# Patient Record
Sex: Female | Born: 1980 | Race: White | Hispanic: No | Marital: Single | Smoking: Never smoker
Health system: Southern US, Community
[De-identification: ages and names within clinical notes are randomized; demographics above are authoritative.]

## PROBLEM LIST (undated history)

## (undated) DIAGNOSIS — Z8041 Family history of malignant neoplasm of ovary: Secondary | ICD-10-CM

## (undated) DIAGNOSIS — D249 Benign neoplasm of unspecified breast: Secondary | ICD-10-CM

## (undated) DIAGNOSIS — Z973 Presence of spectacles and contact lenses: Secondary | ICD-10-CM

## (undated) DIAGNOSIS — N921 Excessive and frequent menstruation with irregular cycle: Secondary | ICD-10-CM

## (undated) DIAGNOSIS — N920 Excessive and frequent menstruation with regular cycle: Secondary | ICD-10-CM

## (undated) DIAGNOSIS — N946 Dysmenorrhea, unspecified: Secondary | ICD-10-CM

## (undated) DIAGNOSIS — D219 Benign neoplasm of connective and other soft tissue, unspecified: Secondary | ICD-10-CM

## (undated) DIAGNOSIS — N809 Endometriosis, unspecified: Secondary | ICD-10-CM

## (undated) DIAGNOSIS — Z5189 Encounter for other specified aftercare: Secondary | ICD-10-CM

## (undated) DIAGNOSIS — D259 Leiomyoma of uterus, unspecified: Secondary | ICD-10-CM

## (undated) DIAGNOSIS — D649 Anemia, unspecified: Secondary | ICD-10-CM

## (undated) DIAGNOSIS — E269 Hyperaldosteronism, unspecified: Secondary | ICD-10-CM

## (undated) HISTORY — DX: Excessive and frequent menstruation with regular cycle: N92.0

## (undated) HISTORY — PX: OVARIAN CYST REMOVAL: SHX89

## (undated) HISTORY — DX: Encounter for other specified aftercare: Z51.89

## (undated) HISTORY — DX: Benign neoplasm of unspecified breast: D24.9

## (undated) HISTORY — DX: Family history of malignant neoplasm of ovary: Z80.41

## (undated) HISTORY — DX: Benign neoplasm of connective and other soft tissue, unspecified: D21.9

## (undated) HISTORY — PX: LAPAROSCOPIC OVARIAN CYSTECTOMY: SHX6248

---
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
## 2011-02-20 HISTORY — PX: LAPAROSCOPIC OVARIAN CYSTECTOMY: SHX6248

## 2019-08-21 LAB — HM PAP SMEAR

## 2019-10-25 ENCOUNTER — Other Ambulatory Visit: Payer: Self-pay

## 2019-10-25 ENCOUNTER — Encounter (HOSPITAL_COMMUNITY): Payer: Self-pay | Admitting: Emergency Medicine

## 2019-10-25 ENCOUNTER — Emergency Department (HOSPITAL_COMMUNITY)
Admission: EM | Admit: 2019-10-25 | Discharge: 2019-10-26 | Disposition: A | Discharge status: Home / Self Care | Payer: BC Managed Care – PPO | Attending: Emergency Medicine | Admitting: Emergency Medicine

## 2019-10-25 ENCOUNTER — Emergency Department (HOSPITAL_COMMUNITY): Payer: BC Managed Care – PPO

## 2019-10-25 DIAGNOSIS — Y939 Activity, unspecified: Secondary | ICD-10-CM | POA: Diagnosis not present

## 2019-10-25 DIAGNOSIS — S59902A Unspecified injury of left elbow, initial encounter: Secondary | ICD-10-CM | POA: Diagnosis present

## 2019-10-25 DIAGNOSIS — S52125A Nondisplaced fracture of head of left radius, initial encounter for closed fracture: Secondary | ICD-10-CM | POA: Diagnosis not present

## 2019-10-25 DIAGNOSIS — W1839XA Other fall on same level, initial encounter: Secondary | ICD-10-CM | POA: Insufficient documentation

## 2019-10-25 DIAGNOSIS — S80812A Abrasion, left lower leg, initial encounter: Secondary | ICD-10-CM

## 2019-10-25 DIAGNOSIS — Y929 Unspecified place or not applicable: Secondary | ICD-10-CM | POA: Insufficient documentation

## 2019-10-25 DIAGNOSIS — Y999 Unspecified external cause status: Secondary | ICD-10-CM | POA: Diagnosis not present

## 2019-10-25 DIAGNOSIS — W19XXXA Unspecified fall, initial encounter: Secondary | ICD-10-CM

## 2019-10-25 MED ORDER — BACITRACIN ZINC 500 UNIT/GM EX OINT
1.0000 "application " | TOPICAL_OINTMENT | Freq: Two times a day (BID) | CUTANEOUS | Status: DC
  Administered 2019-10-26: 1 via TOPICAL
  Filled 2019-10-25: qty 0.9

## 2019-10-25 NOTE — ED Triage Notes (Signed)
Pt c/o left wrist and elbow pain after a fall today. Denies hitting her head, no LOC.

## 2019-10-25 NOTE — ED Triage Notes (Signed)
Emergency Medicine Provider Triage Evaluation Note  Margaret Farrell , a 39 y.o. female  was evaluated in triage.  Pt complains of an injury to the left upper extremity when she fell off a porch that is less than 1 foot off the ground.  She fell onto her left side, she is right-hand dominant.  She caught her self with her left hand and outstretched position feeling a crunch elbow and her wrist  Review of Systems  Positive: Pain swelling extremity Negative: Numbness, head injury, neck pain  Physical Exam  BP 140/82 (BP Location: Right Arm)   Pulse 99   Temp 98.6 F (37 C) (Oral)   Resp 18   SpO2 98%  Gen:   Awake, no distress   HEENT:  Atraumatic  Resp:  Normal effort  Cardiac:  Normal rate  MSK:   Moves extremities without difficulty except for the left upper extremity where she is unable to fully flex or extend at the elbow, she can pronate and 70 with minimal pain, her left wrist has a very supple appearance with no restricted range of motion, she can grip without any difficulty Neuro:  Speech clear, movements are coordinated, strength is normal, she has amblyopia  Medical Decision Making  Medically screening exam initiated at 8:20 PM.  Appropriate orders placed.  Margaret Farrell was informed that the remainder of the evaluation will be completed by another provider, this initial triage assessment does not replace that evaluation, and the importance of remaining in the ED until their evaluation is complete.  Clinical Impression  Concern for possible fracture of the elbow or wrist or forearm, otherwise no significant injuries other than the abrasion to the left lower extremity which was treated with local care.  Patient agreeable x-rays x-rays   Margaret Chapel, MD 10/25/19 2021

## 2019-10-26 MED ORDER — IBUPROFEN 800 MG PO TABS
800.0000 mg | ORAL_TABLET | Freq: Once | ORAL | Status: AC
  Administered 2019-10-26: 800 mg via ORAL
  Filled 2019-10-26: qty 1

## 2019-10-26 NOTE — ED Notes (Signed)
Ortho tech at bedside 

## 2019-10-26 NOTE — Discharge Instructions (Addendum)
Take ibuprofen 3 times a day with meals.  Do not take other anti-inflammatories at the same time (Advil, Motrin, naproxen, Aleve). You may supplement with Tylenol if you need further pain control. Use ice packs  for pain and swelling.  Try to keep your elbow elevated to decrease pain and swelling. Use the sling as needed for pain. Start gentle movements when you are able to based on pain.  Call the orthopedic doctor listed below to set up a follow up appointment. Return to the ER if your hand changes color, goes numb, or with any new, worsening, or concerning symptoms.

## 2019-10-26 NOTE — ED Notes (Signed)
Wound care done to L[facility]

## 2019-10-26 NOTE — ED Notes (Signed)
Pt d/c home per MD order. Discharge summary reviewed with pt, pt verbalizes understanding. Ambulatory off unit with visitor. No s/s of acute distress noted.

## 2019-10-26 NOTE — ED Notes (Signed)
Pt called multiple times for room, no answer.

## 2019-10-26 NOTE — ED Provider Notes (Signed)
[facility] EMERGENCY DEPARTMENT Provider Note   CSN: [id number] Arrival date & time: 10/25/19  1959     History Chief Complaint  Patient presents with   Margaret Farrell is a 39 y.o. female presenting for evaluation of left elbow pain after fall.  Patient states last night she was going up a door when she missed a step, fell onto her left side.  She reached out with her left arm.  She not hit her head or lose consciousness.  She was able to stand up and walk after the fall.  She reports a scrape of her left leg, and some mild low back achiness, but no pain elsewhere.  She is not on blood thinners.  She took ibuprofen for symptoms, has not taken anything else.  She denies chest pain, shortness breath, abdominal pain, loss of bowel bladder control, numbness, or tingling.  She does not have an orthopedic doctor.  She denies previous injury to this elbow.  She recently moved to the area, has not established PCP.   HPI     History reviewed. No pertinent past medical history.  There are no problems to display for this patient.   History reviewed. No pertinent surgical history.   OB History   No obstetric history on file.     No family history on file.  Social History   Tobacco Use   Smoking status: Not on file  Substance Use Topics   Alcohol use: Not on file   Drug use: Not on file    Home Medications Prior to Admission medications   Not on File    Allergies    Sulfa antibiotics  Review of Systems   Review of Systems  Musculoskeletal: Positive for arthralgias and joint swelling.  Neurological: Negative for numbness.  Hematological: Does not bruise/bleed easily.    Physical Exam Updated Vital Signs BP (!) 132/92    Pulse 92    Temp 98.7 F (37.1 C) (Oral)    Resp 18    SpO2 98%   Physical Exam Vitals and nursing note reviewed.  Constitutional:      General: She is not in acute distress.    Appearance: She is well-developed.      Comments: Sitting in the bed in no acute distress  HENT:     Head: Normocephalic and atraumatic.  Cardiovascular:     Rate and Rhythm: Normal rate and regular rhythm.     Pulses: Normal pulses.  Pulmonary:     Effort: Pulmonary effort is normal.     Breath sounds: Normal breath sounds.  Abdominal:     General: There is no distension.     Palpations: There is no mass.     Tenderness: There is no abdominal tenderness. There is no guarding or rebound.  Musculoskeletal:        General: Swelling and tenderness present.     Cervical back: Normal range of motion.  Skin:    General: Skin is warm.     Capillary Refill: Capillary refill takes less than 2 seconds.     Findings: No rash.     Comments: Superficial abrasion of the left lateral lower leg.  No tenderness palpation over the tibia or fibula. No tenderness palpation of back midline spine.  No step-offs or deformities.  Mild soreness with palpation of the low back without focal pain. Mild swelling of the left elbow.  Full active range of motion with pain.  Radial pulses  2+ bilaterally.  Grip strength equal bilaterally.  No tenderness palpation of the wrist or hand.  No tenderness to palpation of the shoulder.  Ambulatory with no difficulty.  Neurological:     Mental Status: She is alert and oriented to person, place, and time.     ED Results / Procedures / Treatments   Labs (all labs ordered are listed, but only abnormal results are displayed) Labs Reviewed - No data to display  EKG None  Radiology DG ELBOW COMPLETE LEFT (3+VIEW)  Result Date: 10/25/2019 CLINICAL DATA:  Fall, arm pain EXAM: LEFT ELBOW - COMPLETE 3+ VIEW COMPARISON:  Left forearm series today FINDINGS: There is a fracture through the left radial head. Associated joint effusion. No additional acute bony abnormality. IMPRESSION: Left radial head fracture. Electronically Signed   By: Rolm Baptise M.D.   On: 10/25/2019 20:57   DG Forearm Left  Result Date:  10/25/2019 CLINICAL DATA:  Fall, arm pain EXAM: LEFT FOREARM - 2 VIEW COMPARISON:  Elbow series and forearm series today FINDINGS: There is a left elbow joint effusion. Fracture noted through the left radial head. No additional forearm fracture. IMPRESSION: Left radial head fracture. Electronically Signed   By: Rolm Baptise M.D.   On: 10/25/2019 20:51   DG Wrist Complete Left  Result Date: 10/25/2019 CLINICAL DATA:  Fall, left arm pain EXAM: LEFT WRIST - COMPLETE 3+ VIEW COMPARISON:  None. FINDINGS: There is no evidence of fracture or dislocation. There is no evidence of arthropathy or other focal bone abnormality. Soft tissues are unremarkable. IMPRESSION: Negative. Electronically Signed   By: Rolm Baptise M.D.   On: 10/25/2019 20:51    Procedures Procedures (including critical care time)  Medications Ordered in ED Medications  bacitracin ointment 1 application (1 application Topical Given 10/26/19 0842)  ibuprofen (ADVIL) tablet 800 mg (800 mg Oral Given 10/26/19 0843)    ED Course  I have reviewed the triage vital signs and the nursing notes.  Pertinent labs & imaging results that were available during my care of the patient were reviewed by me and considered in my medical decision making (see chart for details).  MDM Rules/Calculators/A&P                          Patient presenting for evaluation of left elbow pain and swelling.  On exam, patient appears nontoxic.  She is neurovascular intact.  She has mild swelling and tenderness of the left elbow.  She is an abrasion of her left leg.  X-rays obtained from triage read interpreted by me, shows a radial head fracture which is nondisplaced.  No fracture noted in the wrist, and clinically patient without signs of fracture.  She not hit her head or lose consciousness, I do not believe she needs a head CT.  Discussed findings with patient.  Discussed sling as needed for pain control.  Discussed importance of early mobilization of the elbow.   Encourage patient to follow-up with orthopedics for further evaluation.  Offered pain medication, patient declined, requesting only ibuprofen.  Discussed importance of rest, ice, elevation.  At this time, patient appears safe for discharge.  Return precautions given.  Patient states she understands and agrees to plan.   Final Clinical Impression(s) / ED Diagnoses Final diagnoses:  Closed nondisplaced fracture of head of left radius, initial encounter  Abrasion, left lower leg, initial encounter  Fall, initial encounter    Rx / DC Orders ED Discharge Orders  None       Franchot Heidelberg, PA-C 10/26/19 [id number]    Blanchie Dessert, MD 10/26/19 1555

## 2019-10-26 NOTE — Progress Notes (Signed)
Orthopedic Tech Progress Note Patient Details:  Margaret Farrell 04-21-1980 [id number]  Ortho Devices Type of Ortho Device: Shoulder immobilizer Ortho Device/Splint Location: LUE Ortho Device/Splint Interventions: Ordered, Application   Post Interventions Patient Tolerated: Well Instructions Provided: Care of [place] 10/26/2019, 8:58 AM

## 2021-09-26 IMAGING — DX DG WRIST COMPLETE 3+V*L*
4 series · 4 of 4 positions shown · non-contrast
Comparison: None.

CLINICAL DATA: Fall, left arm pain

EXAM:
LEFT WRIST - COMPLETE 3+ VIEW

[wrist pa]
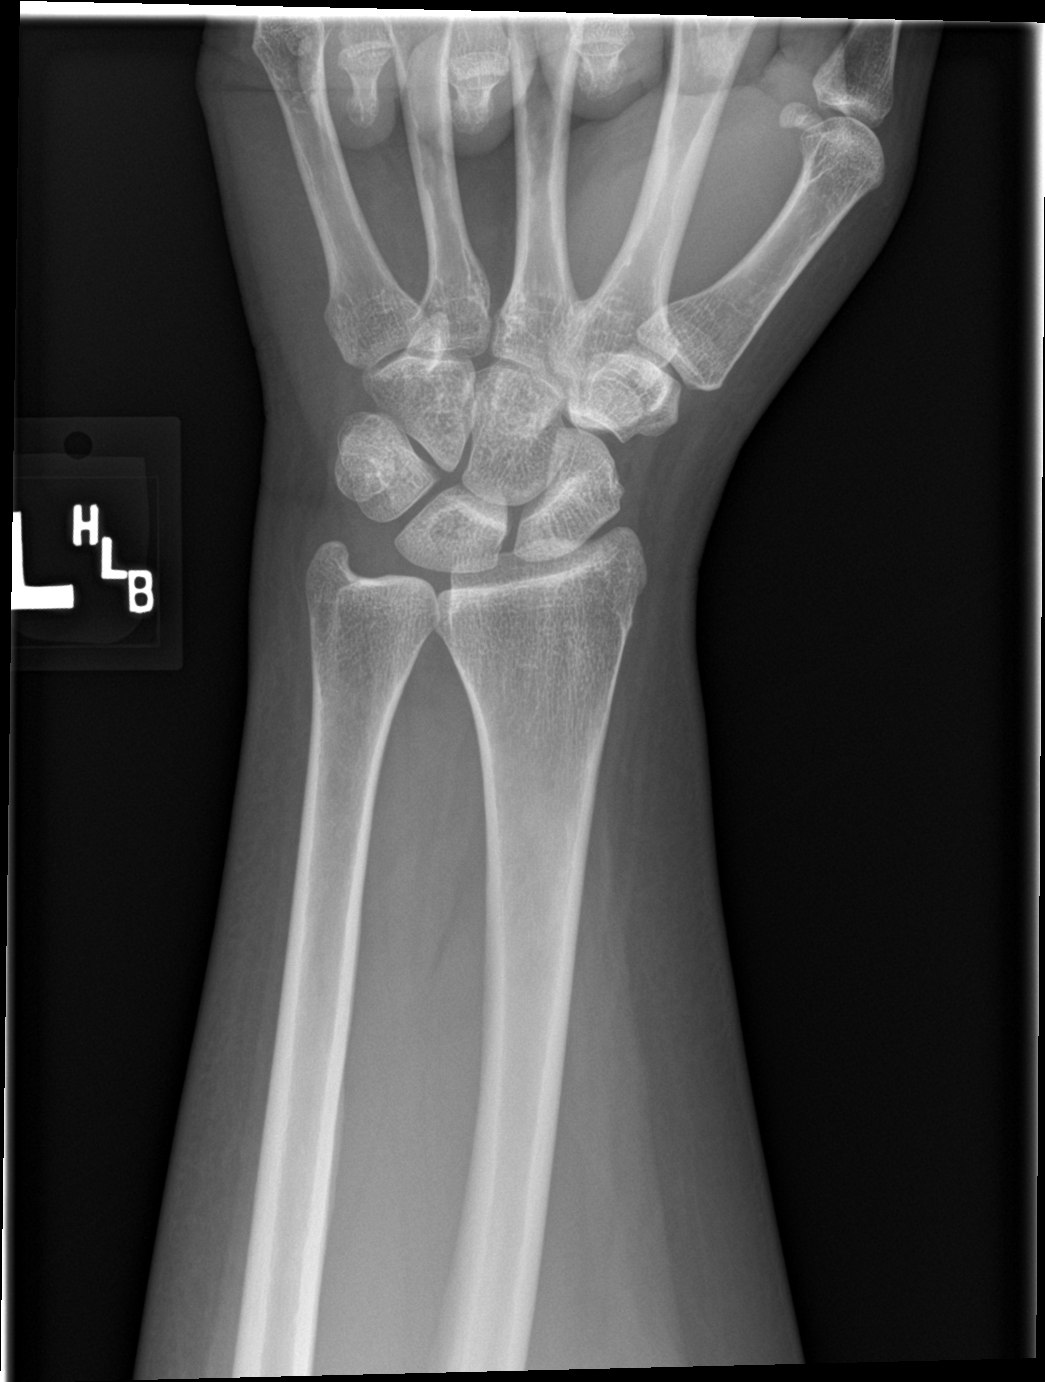

[wrist obl]
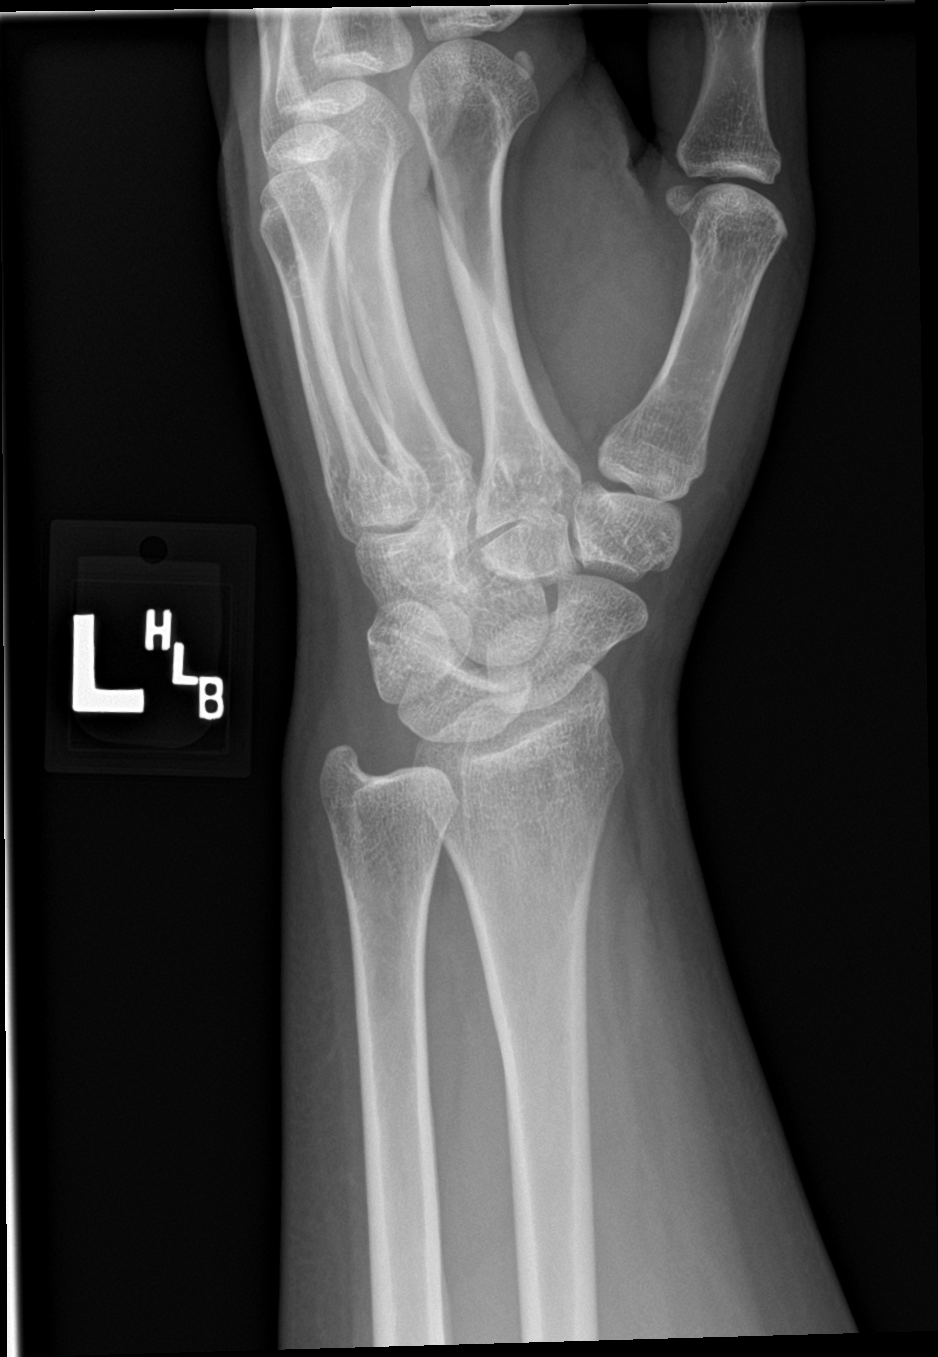

[wrist lat]
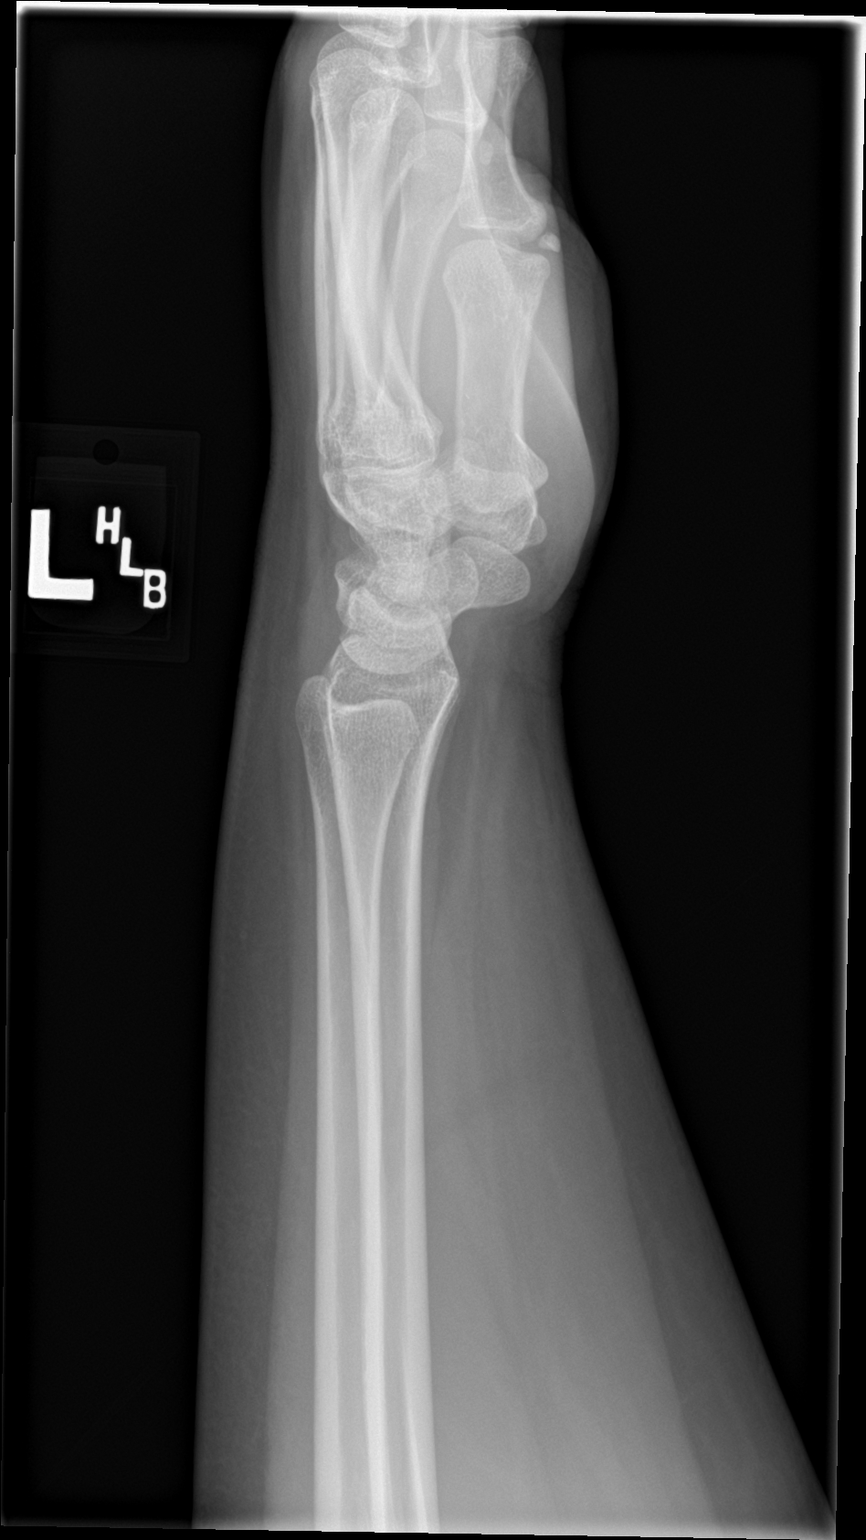

[wrist navicular]
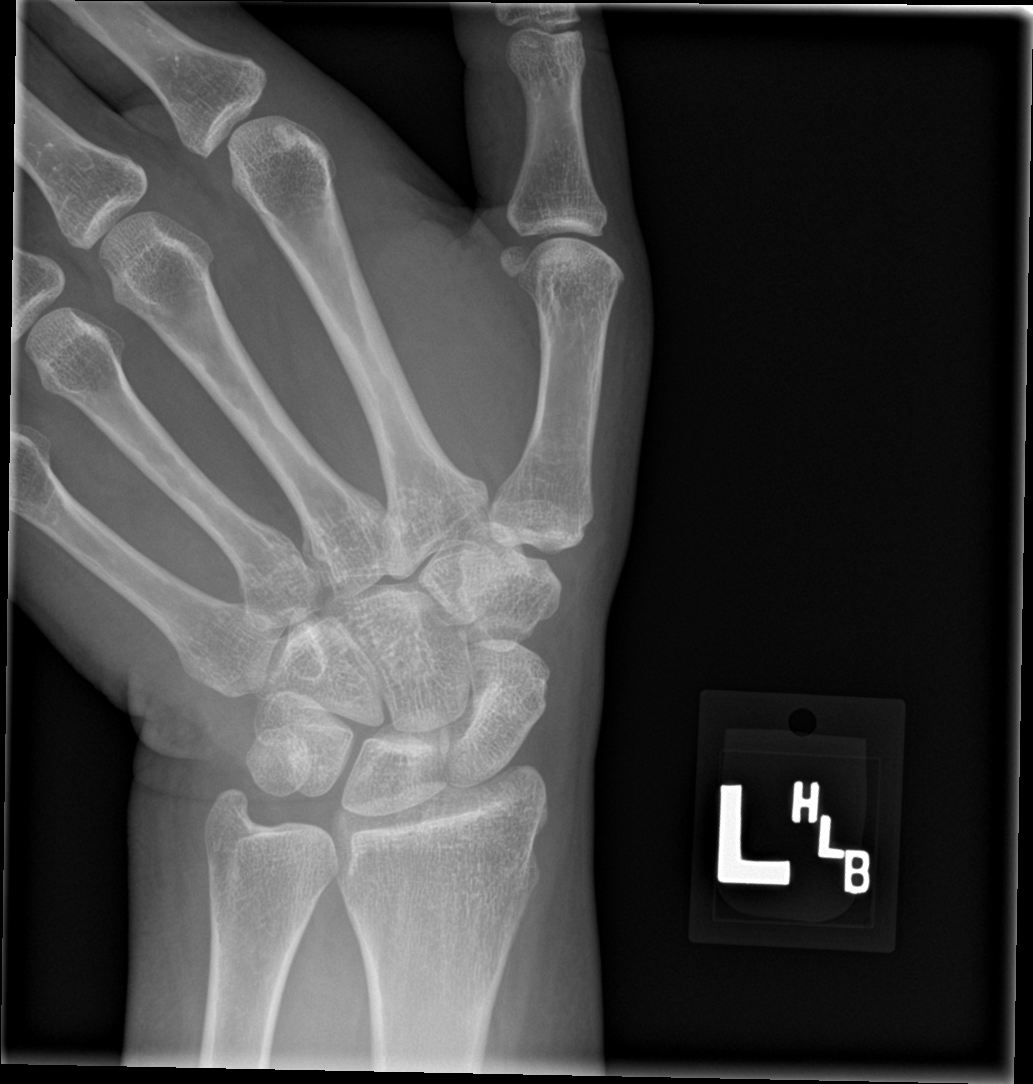

[4 of 4 positions shown; findings below may reference images not displayed]

FINDINGS: There is no evidence of fracture or dislocation. There is no
evidence of arthropathy or other focal bone abnormality. Soft
tissues are unremarkable.
IMPRESSION: Negative.

## 2021-09-26 IMAGING — DX DG ELBOW COMPLETE 3+V*L*
4 series · 4 of 4 positions shown · non-contrast
Comparison: Left forearm series today

CLINICAL DATA: Fall, arm pain

EXAM:
LEFT ELBOW - COMPLETE 3+ VIEW

[elbow ap]
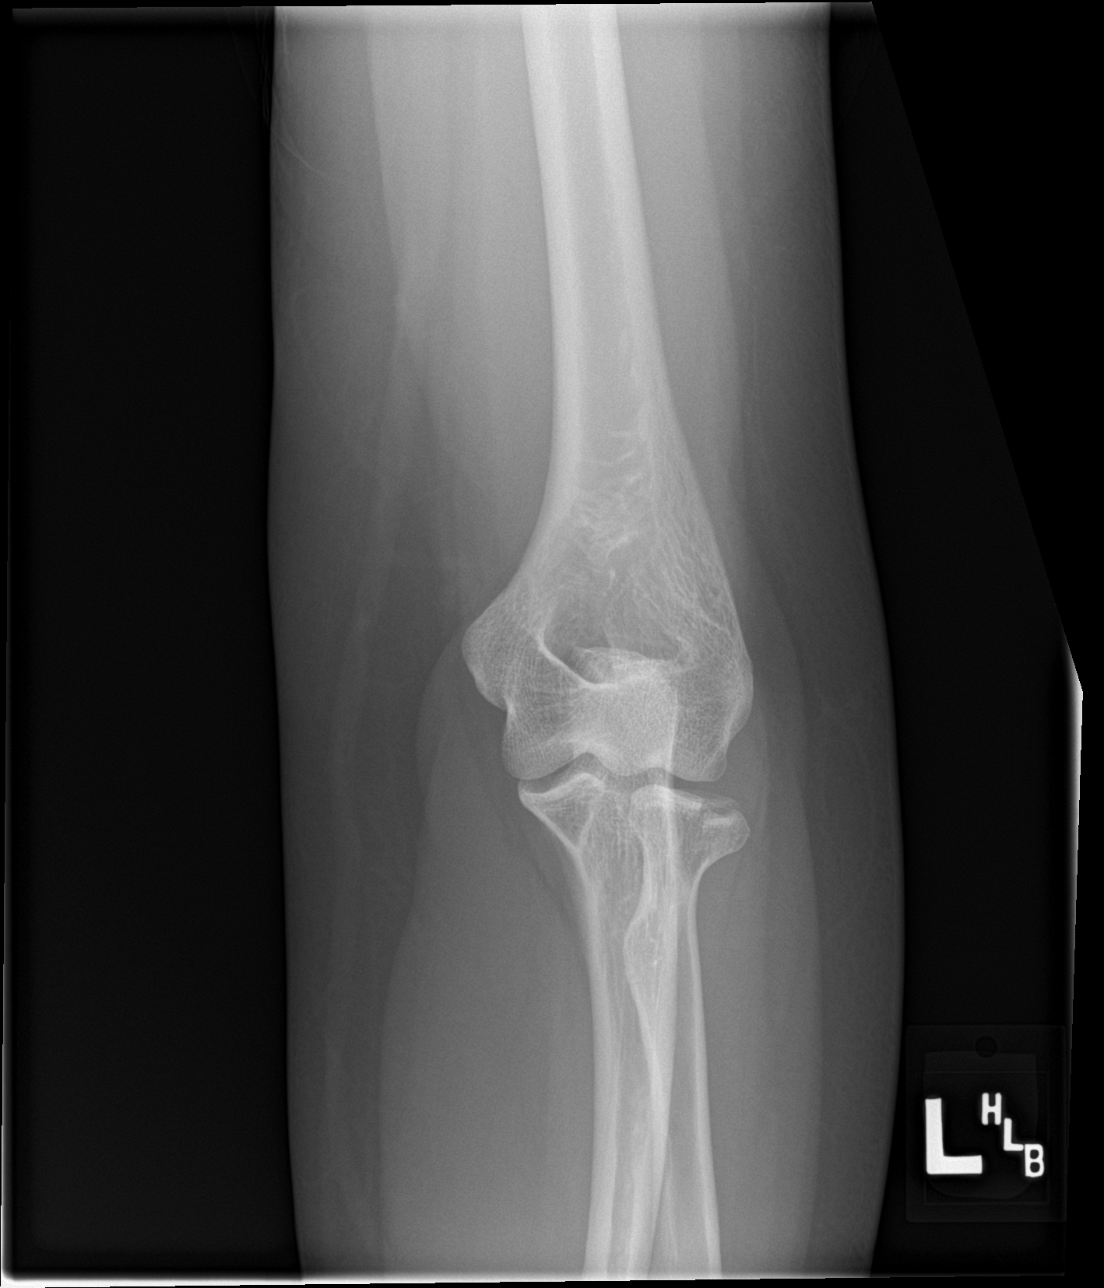

[elbow obl (1 of 2)]
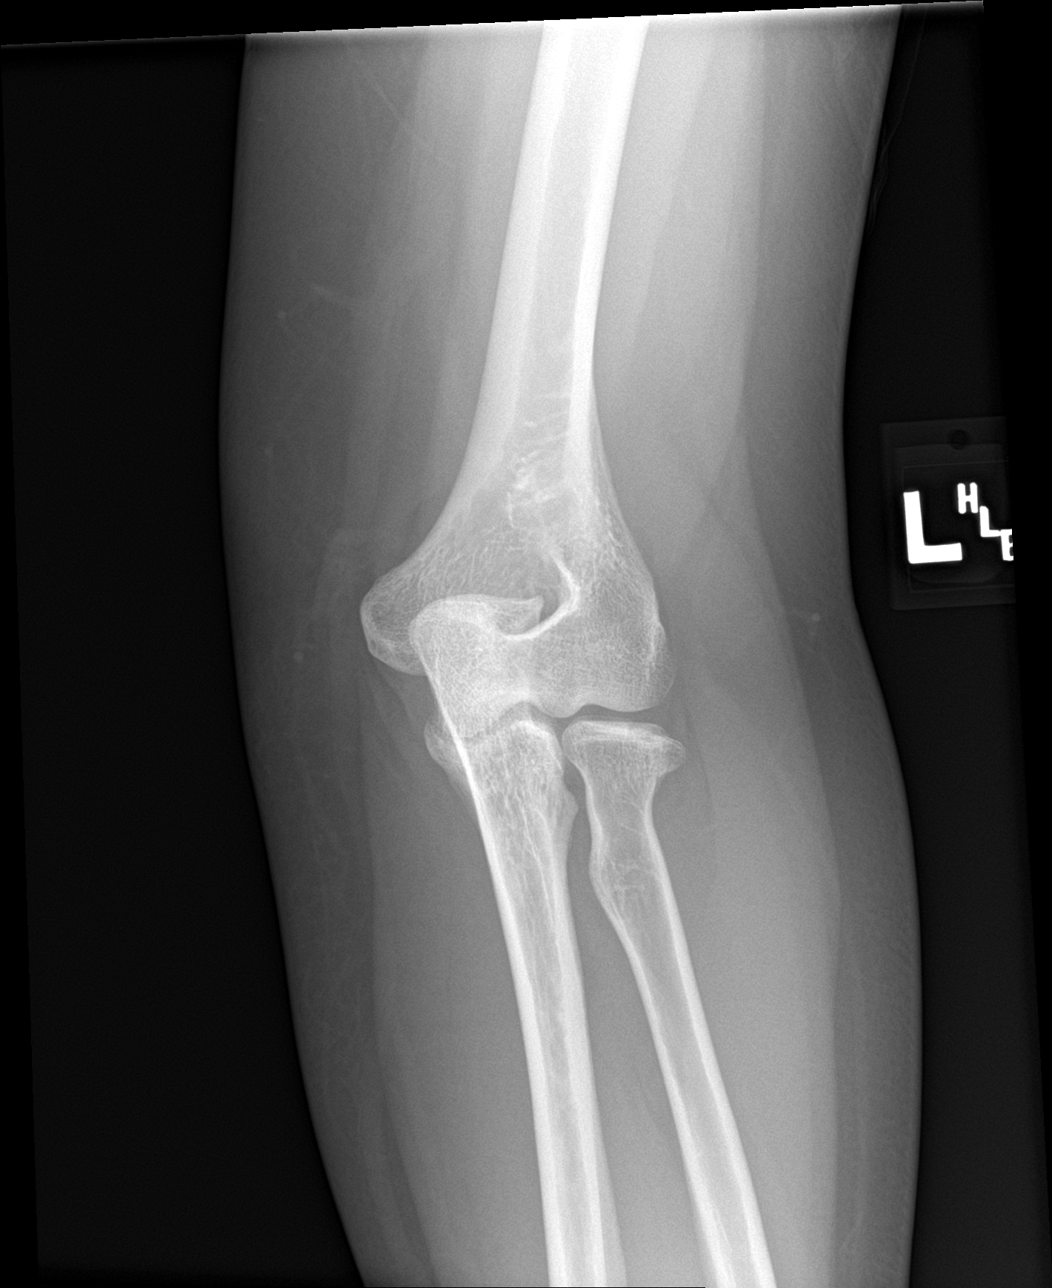

[elbow obl (2 of 2)]
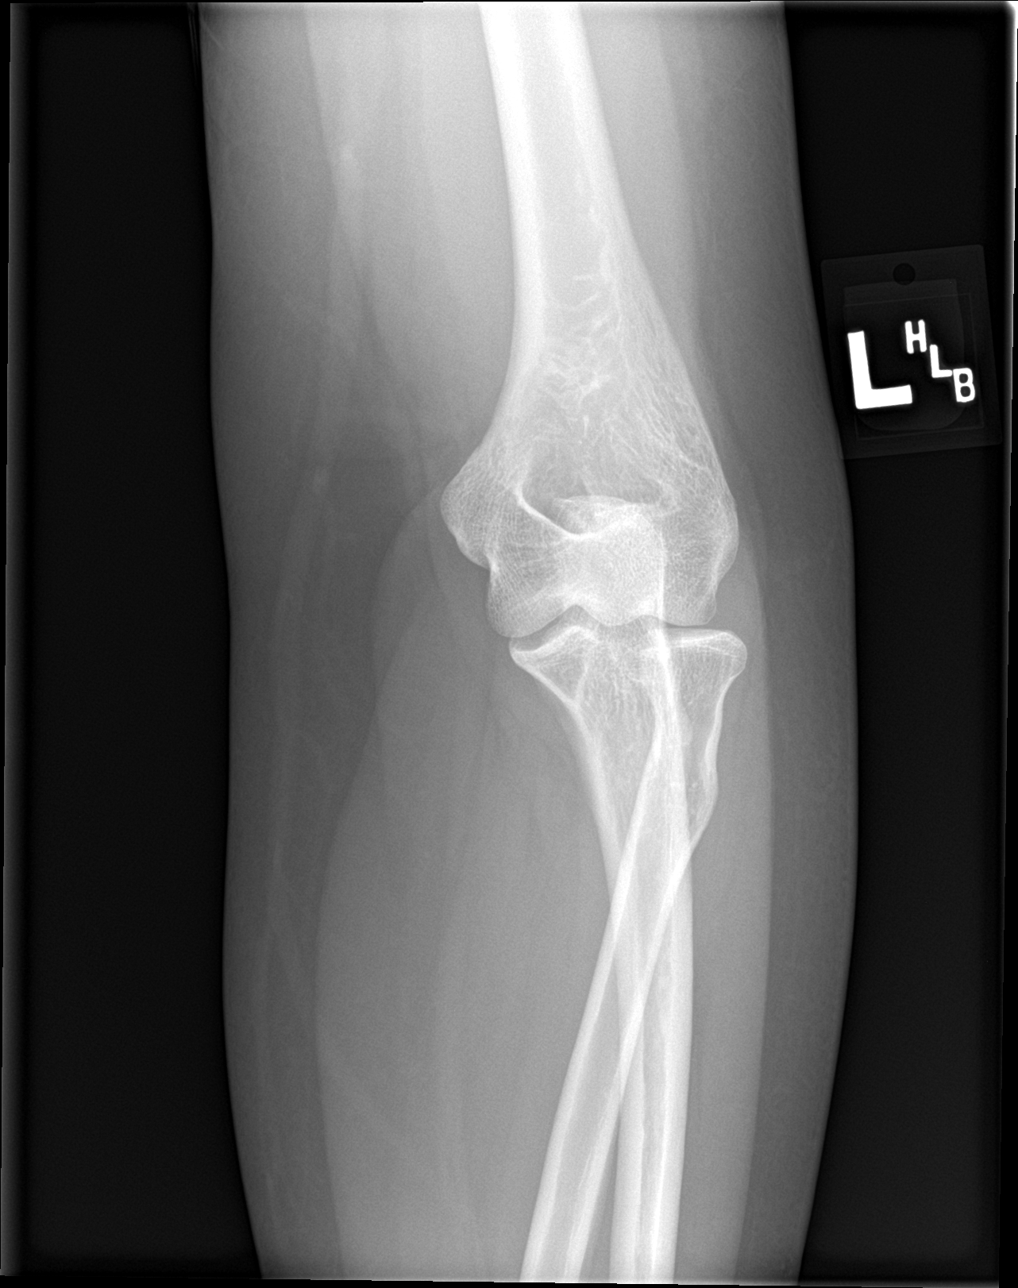

[elbow lat]
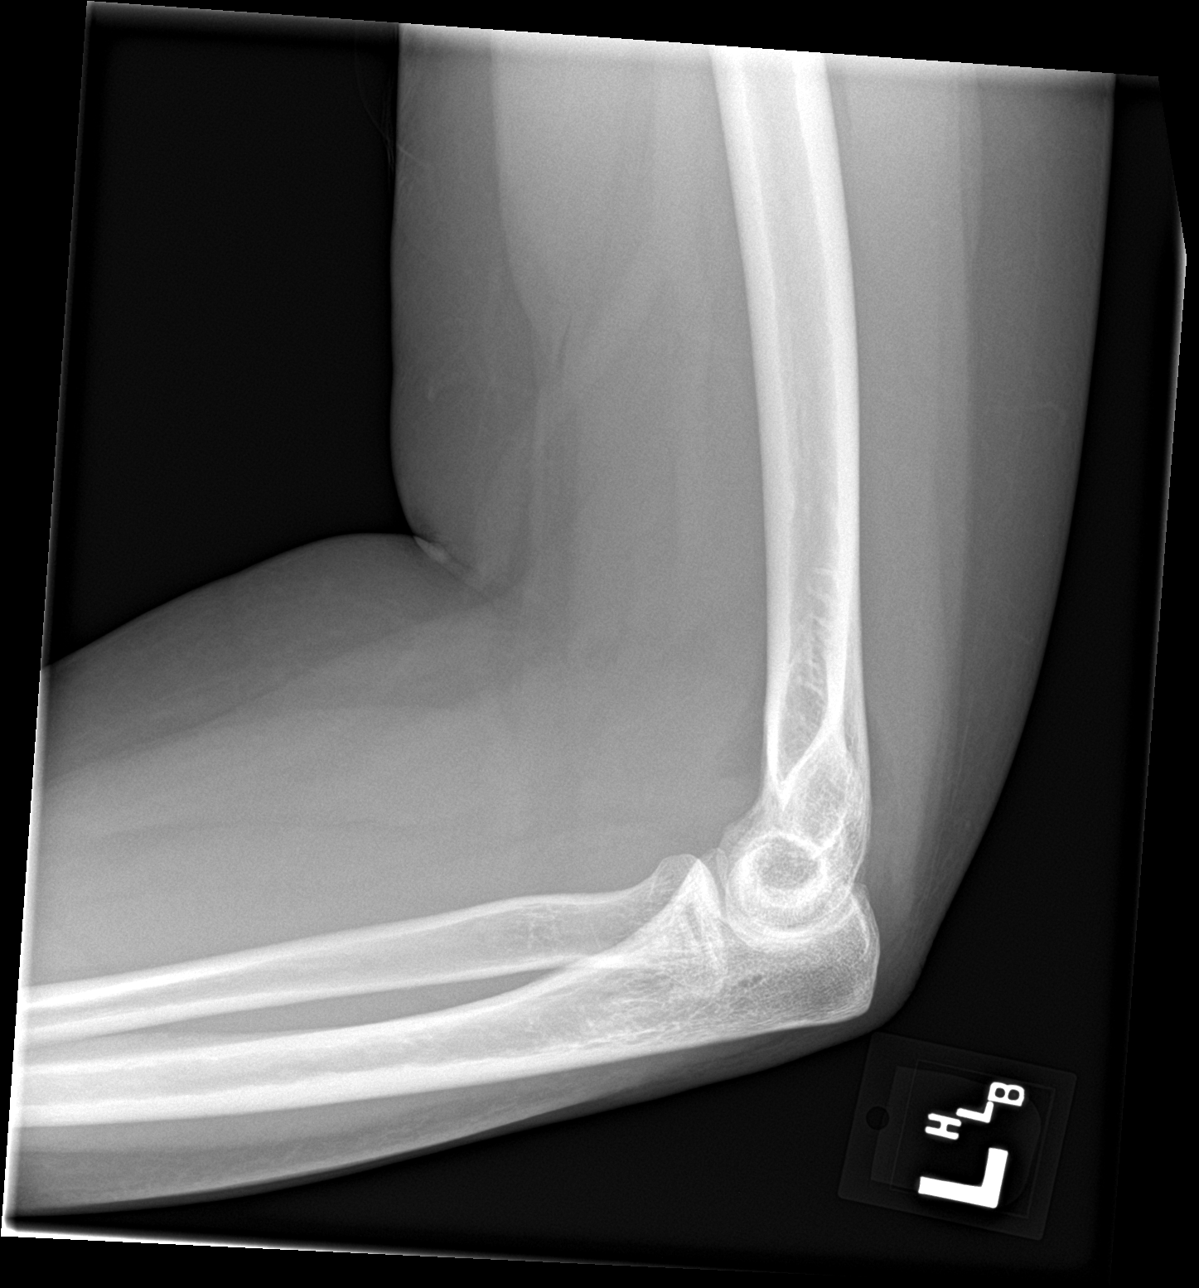

[4 of 4 positions shown; findings below may reference images not displayed]

FINDINGS: There is a fracture through the left radial head. Associated joint
effusion. No additional acute bony abnormality.
IMPRESSION: Left radial head fracture.

## 2021-12-06 DIAGNOSIS — Z01419 Encounter for gynecological examination (general) (routine) without abnormal findings: Secondary | ICD-10-CM | POA: Insufficient documentation

## 2021-12-06 DIAGNOSIS — N809 Endometriosis, unspecified: Secondary | ICD-10-CM | POA: Insufficient documentation

## 2022-01-19 DIAGNOSIS — D3502 Benign neoplasm of left adrenal gland: Secondary | ICD-10-CM

## 2022-01-19 HISTORY — DX: Benign neoplasm of left adrenal gland: D35.02

## 2022-02-01 ENCOUNTER — Emergency Department (HOSPITAL_COMMUNITY): Payer: BC Managed Care – PPO

## 2022-02-01 ENCOUNTER — Emergency Department (HOSPITAL_COMMUNITY)
Admission: EM | Admit: 2022-02-01 | Discharge: 2022-02-01 | Disposition: A | Discharge status: Home / Self Care | Payer: BC Managed Care – PPO | Attending: Emergency Medicine | Admitting: Emergency Medicine

## 2022-02-01 ENCOUNTER — Other Ambulatory Visit: Payer: Self-pay

## 2022-02-01 ENCOUNTER — Encounter (HOSPITAL_COMMUNITY): Payer: Self-pay

## 2022-02-01 DIAGNOSIS — N9489 Other specified conditions associated with female genital organs and menstrual cycle: Secondary | ICD-10-CM | POA: Diagnosis not present

## 2022-02-01 DIAGNOSIS — D259 Leiomyoma of uterus, unspecified: Secondary | ICD-10-CM | POA: Diagnosis not present

## 2022-02-01 DIAGNOSIS — K8020 Calculus of gallbladder without cholecystitis without obstruction: Secondary | ICD-10-CM

## 2022-02-01 DIAGNOSIS — R1084 Generalized abdominal pain: Secondary | ICD-10-CM | POA: Diagnosis present

## 2022-02-01 LAB — CBC WITH DIFFERENTIAL/PLATELET
Abs Immature Granulocytes: 0.04 10*3/uL (ref 0.00–0.07)
Basophils Absolute: 0.1 10*3/uL (ref 0.0–0.1)
Basophils Relative: 1 %
Eosinophils Absolute: 0.1 10*3/uL (ref 0.0–0.5)
Eosinophils Relative: 1 %
HCT: 47.9 % — ABNORMAL HIGH (ref 36.0–46.0)
Hemoglobin: 15.7 g/dL — ABNORMAL HIGH (ref 12.0–15.0)
Immature Granulocytes: 0 %
Lymphocytes Relative: 15 %
Lymphs Abs: 1.9 10*3/uL (ref 0.7–4.0)
MCH: 29.3 pg (ref 26.0–34.0)
MCHC: 32.8 g/dL (ref 30.0–36.0)
MCV: 89.5 fL (ref 80.0–100.0)
Monocytes Absolute: 0.8 10*3/uL (ref 0.1–1.0)
Monocytes Relative: 6 %
Neutro Abs: 9.5 10*3/uL — ABNORMAL HIGH (ref 1.7–7.7)
Neutrophils Relative %: 77 %
Platelets: 267 10*3/uL (ref 150–400)
RBC: 5.35 MIL/uL — ABNORMAL HIGH (ref 3.87–5.11)
RDW: 12.8 % (ref 11.5–15.5)
WBC: 12.4 10*3/uL — ABNORMAL HIGH (ref 4.0–10.5)
nRBC: 0 % (ref 0.0–0.2)

## 2022-02-01 LAB — COMPREHENSIVE METABOLIC PANEL
ALT: 53 U/L — ABNORMAL HIGH (ref 0–44)
AST: 81 U/L — ABNORMAL HIGH (ref 15–41)
Albumin: 3.9 g/dL (ref 3.5–5.0)
Alkaline Phosphatase: 55 U/L (ref 38–126)
Anion gap: 8 (ref 5–15)
BUN: 12 mg/dL (ref 6–20)
CO2: 24 mmol/L (ref 22–32)
Calcium: 9.1 mg/dL (ref 8.9–10.3)
Chloride: 108 mmol/L (ref 98–111)
Creatinine, Ser: 0.74 mg/dL (ref 0.44–1.00)
GFR, Estimated: >60 mL/min (ref >60)
Glucose, Bld: 129 mg/dL — ABNORMAL HIGH (ref 70–99)
Potassium: 3.9 mmol/L (ref 3.5–5.1)
Sodium: 140 mmol/L (ref 135–145)
Total Bilirubin: 0.8 mg/dL (ref 0.3–1.2)
Total Protein: 7.6 g/dL (ref 6.5–8.1)

## 2022-02-01 LAB — LIPASE, BLOOD: Lipase: 49 U/L (ref 11–51)

## 2022-02-01 LAB — URINALYSIS, ROUTINE W REFLEX MICROSCOPIC
Bilirubin Urine: NEGATIVE
Glucose, UA: NEGATIVE mg/dL
Hgb urine dipstick: NEGATIVE
Ketones, ur: NEGATIVE mg/dL
Nitrite: NEGATIVE
Protein, ur: NEGATIVE mg/dL
Specific Gravity, Urine: 1.027 (ref 1.005–1.030)
pH: 5 (ref 5.0–8.0)

## 2022-02-01 LAB — I-STAT CHEM 8, ED
BUN: 15 mg/dL (ref 6–20)
Calcium, Ion: 0.99 mmol/L — ABNORMAL LOW (ref 1.15–1.40)
Chloride: 106 mmol/L (ref 98–111)
Creatinine, Ser: 0.6 mg/dL (ref 0.44–1.00)
Glucose, Bld: 129 mg/dL — ABNORMAL HIGH (ref 70–99)
HCT: 43 % (ref 36.0–46.0)
Hemoglobin: 14.6 g/dL (ref 12.0–15.0)
Potassium: 6.3 mmol/L (ref 3.5–5.1)
Sodium: 137 mmol/L (ref 135–145)
TCO2: 25 mmol/L (ref 22–32)

## 2022-02-01 LAB — I-STAT BETA HCG BLOOD, ED (MC, WL, AP ONLY): I-stat hCG, quantitative: <5 m[IU]/mL (ref <5)

## 2022-02-01 MED ORDER — ONDANSETRON HCL 4 MG/2ML IJ SOLN
4.0000 mg | Freq: Once | INTRAMUSCULAR | Status: DC
  Filled 2022-02-01: qty 2

## 2022-02-01 MED ORDER — IOHEXOL 300 MG/ML  SOLN
100.0000 mL | Freq: Once | INTRAMUSCULAR | Status: AC | PRN
  Administered 2022-02-01: 100 mL via INTRAVENOUS

## 2022-02-01 MED ORDER — SODIUM CHLORIDE (PF) 0.9 % IJ SOLN
INTRAMUSCULAR | Status: AC
  Filled 2022-02-01: qty 50

## 2022-02-01 MED ORDER — KETOROLAC TROMETHAMINE 30 MG/ML IJ SOLN
15.0000 mg | Freq: Once | INTRAMUSCULAR | Status: AC
  Administered 2022-02-01: 15 mg via INTRAVENOUS
  Filled 2022-02-01: qty 1

## 2022-02-01 MED ORDER — DICYCLOMINE HCL 20 MG PO TABS: 20.0000 mg | ORAL_TABLET | Freq: Two times a day (BID) | ORAL | 0 refills | Status: DC

## 2022-02-01 MED ORDER — FAMOTIDINE 20 MG PO TABS: 20.0000 mg | ORAL_TABLET | Freq: Two times a day (BID) | ORAL | 0 refills | Status: DC

## 2022-02-01 NOTE — ED Provider Notes (Signed)
[facility] DEPT Provider Note   CSN: [id number] Arrival date & time: 02/01/22  0229     History  Chief Complaint  Patient presents with   Abdominal Pain   Back Pain    Margaret Farrell is a 41 y.o. female.  The history is provided by the patient.  Abdominal Pain Pain location:  Generalized Pain quality: aching   Pain radiates to:  Does not radiate Pain severity:  Severe Onset quality:  Sudden Duration:  4 hours Timing:  Constant Progression:  Unchanged Chronicity:  Recurrent (has happened multiple times in the past but usually a bowel movement of one emesis resolves it quickly, tonight that is not the case.) Context: not recent travel and not trauma   Relieved by:  Nothing Worsened by:  Nothing Ineffective treatments:  None tried Associated symptoms: no chest pain, no diarrhea, no dysuria and no fever   Risk factors: no recent hospitalization   Back Pain Pain location: midback. Context: not MVA   Relieved by:  Nothing Worsened by:  Nothing Associated symptoms: abdominal pain   Associated symptoms: no chest pain, no dysuria and no fever   Risk factors: no hx of cancer and no recent surgery   Patient is seeing GI for these symptoms and is scheduled for colonoscopy.       Home Medications Prior to Admission medications   Not on File      Allergies    Sulfa antibiotics    Review of Systems   Review of Systems  Constitutional:  Negative for fever.  HENT:  Negative for facial swelling.   Eyes:  Negative for redness.  Respiratory:  Negative for stridor.   Cardiovascular:  Negative for chest pain.  Gastrointestinal:  Positive for abdominal pain. Negative for diarrhea.  Genitourinary:  Negative for dysuria.  Musculoskeletal:  Positive for back pain.  All other systems reviewed and are negative.   Physical Exam Updated Vital Signs BP 115/78   Pulse 81   Temp 97.7 F (36.5 C) (Oral)   Resp 19   Ht '5\' 4"'$  (1.626 m)   Wt 90.7  kg   SpO2 99%   BMI 34.33 kg/m  Physical Exam Vitals and nursing note reviewed.  Constitutional:      General: She is not in acute distress.    Appearance: She is well-developed.  HENT:     Head: Normocephalic and atraumatic.     Nose: Nose normal.  Eyes:     Extraocular Movements: Extraocular movements intact.     Pupils: Pupils are equal, round, and reactive to light.  Cardiovascular:     Rate and Rhythm: Normal rate and regular rhythm.     Pulses: Normal pulses.     Heart sounds: Normal heart sounds.  Pulmonary:     Effort: Pulmonary effort is normal. No respiratory distress.     Breath sounds: Normal breath sounds.  Abdominal:     General: Bowel sounds are normal. There is no distension.     Palpations: Abdomen is soft.     Tenderness: There is no abdominal tenderness. There is no guarding or rebound. Negative signs include Murphy's sign, Rovsing's sign and McBurney's sign.  Genitourinary:    Vagina: No vaginal discharge.  Musculoskeletal:        General: Normal range of motion.     Cervical back: Normal range of motion and neck supple.  Skin:    General: Skin is warm and dry.     Capillary Refill: Capillary  refill takes less than 2 seconds.     Findings: No erythema or rash.  Neurological:     General: No focal deficit present.     Mental Status: She is alert and oriented to person, place, and time.     Deep Tendon Reflexes: Reflexes normal.  Psychiatric:        Mood and Affect: Mood normal.     ED Results / Procedures / Treatments   Labs (all labs ordered are listed, but only abnormal results are displayed) Results for orders placed or performed during the hospital encounter of 02/01/22  CBC with Differential  Result Value Ref Range   WBC 12.4 (H) 4.0 - 10.5 K/uL   RBC 5.35 (H) 3.87 - 5.11 MIL/uL   Hemoglobin 15.7 (H) 12.0 - 15.0 g/dL   HCT 47.9 (H) 36.0 - 46.0 %   MCV 89.5 80.0 - 100.0 fL   MCH 29.3 26.0 - 34.0 pg   MCHC 32.8 30.0 - 36.0 g/dL   RDW 12.8  11.5 - 15.5 %   Platelets 267 150 - 400 K/uL   nRBC 0.0 0.0 - 0.2 %   Neutrophils Relative % 77 %   Neutro Abs 9.5 (H) 1.7 - 7.7 K/uL   Lymphocytes Relative 15 %   Lymphs Abs 1.9 0.7 - 4.0 K/uL   Monocytes Relative 6 %   Monocytes Absolute 0.8 0.1 - 1.0 K/uL   Eosinophils Relative 1 %   Eosinophils Absolute 0.1 0.0 - 0.5 K/uL   Basophils Relative 1 %   Basophils Absolute 0.1 0.0 - 0.1 K/uL   Immature Granulocytes 0 %   Abs Immature Granulocytes 0.04 0.00 - 0.07 K/uL  Comprehensive metabolic panel  Result Value Ref Range   Sodium 140 135 - 145 mmol/L   Potassium 3.9 3.5 - 5.1 mmol/L   Chloride 108 98 - 111 mmol/L   CO2 24 22 - 32 mmol/L   Glucose, Bld 129 (H) 70 - 99 mg/dL   BUN 12 6 - 20 mg/dL   Creatinine, Ser 0.74 0.44 - 1.00 mg/dL   Calcium 9.1 8.9 - 10.3 mg/dL   Total Protein 7.6 6.5 - 8.1 g/dL   Albumin 3.9 3.5 - 5.0 g/dL   AST 81 (H) 15 - 41 U/L   ALT 53 (H) 0 - 44 U/L   Alkaline Phosphatase 55 38 - 126 U/L   Total Bilirubin 0.8 0.3 - 1.2 mg/dL   GFR, Estimated >60 >60 mL/min   Anion gap 8 5 - 15  Lipase, blood  Result Value Ref Range   Lipase 49 11 - 51 U/L  Urinalysis, Routine w reflex microscopic Urine, Clean Catch  Result Value Ref Range   Color, Urine YELLOW YELLOW   APPearance CLEAR CLEAR   Specific Gravity, Urine 1.027 1.005 - 1.030   pH 5.0 5.0 - 8.0   Glucose, UA NEGATIVE NEGATIVE mg/dL   Hgb urine dipstick NEGATIVE NEGATIVE   Bilirubin Urine NEGATIVE NEGATIVE   Ketones, ur NEGATIVE NEGATIVE mg/dL   Protein, ur NEGATIVE NEGATIVE mg/dL   Nitrite NEGATIVE NEGATIVE   Leukocytes,Ua SMALL (A) NEGATIVE   RBC / HPF 0-5 0 - 5 RBC/hpf   WBC, UA 0-5 0 - 5 WBC/hpf   Bacteria, UA FEW (A) NONE SEEN   Squamous Epithelial / LPF 0-5 0 - 5   Mucus PRESENT   I-stat chem 8, ED (not at [facility], [facility] or [facility])  Result Value Ref Range   Sodium 137 135 - 145 mmol/L  Potassium 6.3 (HH) 3.5 - 5.1 mmol/L   Chloride 106 98 - 111 mmol/L   BUN 15 6 - 20 mg/dL   Creatinine,  Ser 0.60 0.44 - 1.00 mg/dL   Glucose, Bld 129 (H) 70 - 99 mg/dL   Calcium, Ion 0.99 (L) 1.15 - 1.40 mmol/L   TCO2 25 22 - 32 mmol/L   Hemoglobin 14.6 12.0 - 15.0 g/dL   HCT 43.0 36.0 - 46.0 %   Comment NOTIFIED PHYSICIAN   I-Stat Beta hCG blood, ED ([facility], [facility], [facility] only)  Result Value Ref Range   I-stat hCG, quantitative <5.0 <5 mIU/mL   Comment 3           CT ABDOMEN PELVIS W CONTRAST  Result Date: 02/01/2022 CLINICAL DATA:  Mid back pain, blunt poly trauma EXAM: CT ABDOMEN AND PELVIS WITH CONTRAST TECHNIQUE: Multidetector CT imaging of the abdomen and pelvis was performed using the standard protocol following bolus administration of intravenous contrast. RADIATION DOSE REDUCTION: This exam was performed according to the departmental dose-optimization program which includes automated exposure control, adjustment of the mA and/or kV according to patient size and/or use of iterative reconstruction technique. CONTRAST:  174m OMNIPAQUE IOHEXOL 300 MG/ML  SOLN COMPARISON:  None Available. FINDINGS: Lower chest:  No contributory findings. Hepatobiliary: 2 small cystic densities in the liver.Faint rounded density in the gallbladder. No inflammation or biliary obstruction noted. Pancreas: Unremarkable. Spleen: Unremarkable. Adrenals/Urinary Tract: 13 mm nodule in the left adrenal gland. No hydronephrosis or stone. Unremarkable bladder. Stomach/Bowel:  No obstruction. No appendicitis. Vascular/Lymphatic: No acute vascular abnormality. No mass or adenopathy. Reproductive:Extensive replacement of the uterus by lobulated masses, the largest measuring nearly 10 cm, consistent with fibroids. Similar size ovaries. Other: No ascites or pneumoperitoneum. Musculoskeletal: No acute abnormalities. IMPRESSION: 1. No acute finding. 2. Cholelithiasis and numerous uterine fibroids. 3. 13 mm left adrenal nodule, statistically an adenoma. If patient has history of cancer, recommend further evaluation with adrenal protocol abdomen  CT without and with contrast. If no history of cancer, consider followup by CT in 12 months. Electronically Signed   By: JJorje GuildM.D.   On: 02/01/2022 05:04    EKG None  Radiology CT ABDOMEN PELVIS W CONTRAST  Result Date: 02/01/2022 CLINICAL DATA:  Mid back pain, blunt poly trauma EXAM: CT ABDOMEN AND PELVIS WITH CONTRAST TECHNIQUE: Multidetector CT imaging of the abdomen and pelvis was performed using the standard protocol following bolus administration of intravenous contrast. RADIATION DOSE REDUCTION: This exam was performed according to the departmental dose-optimization program which includes automated exposure control, adjustment of the mA and/or kV according to patient size and/or use of iterative reconstruction technique. CONTRAST:  1044mOMNIPAQUE IOHEXOL 300 MG/ML  SOLN COMPARISON:  None Available. FINDINGS: Lower chest:  No contributory findings. Hepatobiliary: 2 small cystic densities in the liver.Faint rounded density in the gallbladder. No inflammation or biliary obstruction noted. Pancreas: Unremarkable. Spleen: Unremarkable. Adrenals/Urinary Tract: 13 mm nodule in the left adrenal gland. No hydronephrosis or stone. Unremarkable bladder. Stomach/Bowel:  No obstruction. No appendicitis. Vascular/Lymphatic: No acute vascular abnormality. No mass or adenopathy. Reproductive:Extensive replacement of the uterus by lobulated masses, the largest measuring nearly 10 cm, consistent with fibroids. Similar size ovaries. Other: No ascites or pneumoperitoneum. Musculoskeletal: No acute abnormalities. IMPRESSION: 1. No acute finding. 2. Cholelithiasis and numerous uterine fibroids. 3. 13 mm left adrenal nodule, statistically an adenoma. If patient has history of cancer, recommend further evaluation with adrenal protocol abdomen CT without and with contrast. If no history of  cancer, consider followup by CT in 12 months. Electronically Signed   By: Jorje Guild M.D.   On: 02/01/2022 05:04     Procedures Procedures    Medications Ordered in ED Medications  ondansetron (ZOFRAN) injection 4 mg (4 mg Intravenous Not Given 02/01/22 0438)  iohexol (OMNIPAQUE) 300 MG/ML solution 100 mL (100 mLs Intravenous Contrast Given 02/01/22 0441)  sodium chloride (PF) 0.9 % injection (  Given by Other 02/01/22 0441)  ketorolac (TORADOL) 30 MG/ML injection 15 mg (15 mg Intravenous Given 02/01/22 0531)    ED Course/ Medical Decision Making/ A&P                           Medical Decision Making Entire abdominal pain tonight after eating chicken parmigiana   Amount and/or Complexity of Data Reviewed Independent Historian: parent    Details: See above  External Data Reviewed: notes.    Details: Previous notes reviewed  Labs: ordered.    Details: All labs reviewed: 12.4, 15.7 hemoglobin elevated, normal platelet count.  Normal sodium 140, normal potassium 3.9, normal creatinine.  Normal bilirubin, elevated AST 81, elevated ALT 53 Radiology: ordered and independent interpretation performed.    Details: Fibroids on CT by me   Risk Prescription drug management. Risk Details: Differential is gerd, biliary colic, pancreatitis, gas or IBS: No signs of acute cholecystitis on [place] and no murphy's sign on my exam.  I could be biliary colic or GERD.  I have started a bland diet and have referred to general surgery to discuss elective removal of the gall bladder.  I have started pepcid to cover for GERD.  I have informed the patient of fibroids and need for close follow up with GYN for ongoing care.  I have also informed patient of her adrenal adenoma and need to have PMD order outpatient CT in 12 months to assess stability of adenoma seen on CT.  I have also printed this on DC papers.  Patient is well appearing and stable for discharge with close follow up.  Strict return precautions.   Keep your follow up previously scheduled with GI    Final Clinical Impression(s) / ED Diagnoses Final diagnoses:   Uterine leiomyoma, unspecified location   Return for intractable cough, coughing up blood, fevers > 100.4 unrelieved by medication, shortness of breath, intractable vomiting, chest pain, shortness of breath, weakness, numbness, changes in speech, facial asymmetry, abdominal pain, passing out, Inability to tolerate liquids or food, cough, altered mental status or any concerns. No signs of systemic illness or infection. The patient is nontoxic-appearing on exam and vital signs are within normal limits.  I have reviewed the triage vital signs and the nursing notes. Pertinent labs & imaging results that were available during my care of the patient were reviewed by me and considered in my medical decision making (see chart for details). After history, exam, and medical workup I feel the patient has been appropriately medically screened and is safe for discharge home. Pertinent diagnoses were discussed with the patient. Patient was given return precautions.  Rx / DC Orders ED Discharge Orders     None         Aiyannah Fayad, MD 02/01/22 [phone]

## 2022-02-01 NOTE — ED Notes (Signed)
Pt return from CT scanner, NAD noted, pt alert and comfortable at this time

## 2022-02-01 NOTE — ED Notes (Signed)
Pt to CT scanner at this time, pt refused offer for Zofran, reports she does not have any n/v sensation.

## 2022-02-01 NOTE — ED Notes (Signed)
US tech at the bedside

## 2022-02-01 NOTE — ED Triage Notes (Signed)
Says she has a long hx of back pain but ~0030 today began having midline thoracic back ain just below shoulder blades th at wraps around to the sternal area. Pain described as a full body muscle spasm.   Says the doctors at [facility] PCP recommended coming to ER if they are not open when this happens.

## 2022-02-19 HISTORY — PX: GALLBLADDER SURGERY: SHX652

## 2022-02-21 ENCOUNTER — Ambulatory Visit (INDEPENDENT_AMBULATORY_CARE_PROVIDER_SITE_OTHER): Payer: BC Managed Care – PPO

## 2022-02-21 ENCOUNTER — Encounter: Payer: Self-pay | Admitting: Podiatry

## 2022-02-21 ENCOUNTER — Ambulatory Visit: Payer: BC Managed Care – PPO | Admitting: Podiatry

## 2022-02-21 DIAGNOSIS — M722 Plantar fascial fibromatosis: Secondary | ICD-10-CM

## 2022-02-21 MED ORDER — TRIAMCINOLONE ACETONIDE 10 MG/ML IJ SUSP
10.0000 mg | Freq: Once | INTRAMUSCULAR | Status: AC
  Administered 2022-02-21: 10 mg

## 2022-02-21 NOTE — Progress Notes (Signed)
Subjective:   Patient ID: Margaret Farrell, female   DOB: 42 y.o.   MRN: [id number]   HPI Patient states she has had severe pain in her heels right over left over the last 1 year.  She has tried a over-the-counter night splint and brace that she bought which have not been effective and states the right 1 has been very bad.  Patient's not doing the type of exercise and she likes and patient does not smoke   Review of Systems  All other systems reviewed and are negative.       Objective:  Physical Exam Vitals and nursing note reviewed.  Constitutional:      Appearance: She is well-developed.  Pulmonary:     Effort: Pulmonary effort is normal.  Musculoskeletal:        General: Normal range of motion.  Skin:    General: Skin is warm.  Neurological:     Mental Status: She is alert.     Neurovascular status intact muscle strength adequate range of motion adequate with exquisite discomfort medial fascial band right mild discomfort on the left fluid buildup around the plantar heel region noted     Assessment:  Acute plantar fasciitis right over left with inflammation fluid of the medial band     Plan:  H&P x-rays reviewed and I went ahead today did sterile prep and injected the plantar fascia right 3 mg Kenalog 5 mg Xylocaine applied fascial brace gave instructions for physical therapy and shoe gear modifications.  Reappoint 2 weeks earlier if needed with possibility for orthotics at that time  X-rays indicate small spur left none right no indication stress fracture arthritis

## 2022-02-21 NOTE — Patient Instructions (Signed)

## 2022-03-07 ENCOUNTER — Ambulatory Visit: Payer: BC Managed Care – PPO | Admitting: Podiatry

## 2022-03-07 ENCOUNTER — Encounter: Payer: Self-pay | Admitting: Podiatry

## 2022-03-07 DIAGNOSIS — M722 Plantar fascial fibromatosis: Secondary | ICD-10-CM | POA: Diagnosis not present

## 2022-03-07 NOTE — Progress Notes (Signed)
Patient states she is subjective:   Patient ID: Margaret Farrell, female   DOB: 42 y.o.   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  Patient is casted for functional orthotics will be seen back when returned

## 2022-03-09 ENCOUNTER — Ambulatory Visit: Payer: Self-pay | Admitting: Surgery

## 2022-03-09 NOTE — H&P (View-Only) (Signed)
Margaret Farrell [id number]   Referring Provider:  Syliva Overman   Subjective   Chief Complaint: New Consultation (Cholelithiasis)     History of Present Illness: Very pleasant 42yo woman with 3 of endometriosis, fibroids and ovarian cyst but no other medical problems who presents as a consultation from the ER where she presented on 12/14 with thoracic back pain radiating around to the sternum, muscle spasm quality as well as generalized abdominal pain of an aching quality. The latter has happened multiple times in the past but typically would resolve with emesis or bowel movement.  First occurrence was in June when she was on vacation on a cruise.  She states that the pain was across her entire upper abdomen radiating to her back.  When she had had multiple bowel movements and bouts of emesis and felt like she had completely emptied out her GI tract, the pain resolved.  She feels that the vasalva opponent of the vomiting seems to alleviate the pain as well.  She has had 3 or 4 more attacks, each lasting longer.  These do seem to be temporally related to eating higher fat meals.  The most recent episode occurred after eating chicken Parmesan, she ate around 6 and this woke her from sleep around midnight. She had an endoscopy by Dr. Paulita Fujita a few weeks ago, she reports that this was negative.  Records not available at this time.  Previous abdominal surgery includes laparoscopic ovarian cystectomy out 10 years ago.   Labs 02/01/22: CMP (AST/ALT 81/53, otherwise wnl); WBC 12/4, hb 15.7, plt 267  CT 02/01/22: IMPRESSION: 1. No acute finding. 2. Cholelithiasis and numerous uterine fibroids ("Extensive replacement of the uterus by lobulated masses, the largest measuring nearly 10 cm, consistent with fibroids. Similar size ovaries."). 3. 13 mm left adrenal nodule, statistically an adenoma. If patient has history of cancer, recommend further evaluation with adrenal protocol abdomen CT without and  with contrast. If no history of cancer, consider followup by CT in 12 months.  [place] 02/01/22:  FINDINGS: Gallbladder:   Dependent and shadowing 2 cm gallstone at the gallbladder neck. Minimally distended gallbladder. No wall thickening visualized. No sonographic Murphy sign noted by sonographer.   Common bile duct:   Diameter: 0.6 cm.  No intra-or extrahepatic biliary ductal dilation   Liver:   Within normal limits in parenchymal echogenicity.   Smooth contour liver.   Multifocal hepatic lesions during; 2.5 cm solid lesion of the gallbladder fossa likely consistent with hemangioma versus adenoma, and additional rounded anechoic lesions consistent with simple hepatic cysts   portal vein is patent on color Doppler imaging with normal direction of blood flow towards the liver.   Other: No perihepatic ascites.   IMPRESSION: Cholelithiasis with minimally distended gallbladder.   No additional sonographic findings to suggest acute cholecystitis. Review of Systems: A complete review of systems was obtained from the patient.  I have reviewed this information and discussed as appropriate with the patient.  See HPI as well for other ROS.   Medical History: History reviewed. No pertinent past medical history.  There is no problem list on file for this patient.   Past Surgical History:  Procedure Laterality Date   LAPAROSCOPIC OVARIAN CYSTECTOMY       Allergies  Allergen Reactions   Sulfa (Sulfonamide Antibiotics) Hives    Current Outpatient Medications on File Prior to Visit  Medication Sig Dispense Refill   norethindrone-e.estradioL-iron (LO LOESTRIN FE) 1 mg-10 mcg (24)/10 mcg (2)  No current facility-administered medications on file prior to visit.    Family History  Problem Relation Age of Onset   Hyperlipidemia (Elevated cholesterol) Mother    High blood pressure (Hypertension) Father    Hyperlipidemia (Elevated cholesterol) Father    Diabetes Father       Social History   Tobacco Use  Smoking Status Never  Smokeless Tobacco Never     Social History   Socioeconomic History   Marital status: Single  Tobacco Use   Smoking status: Never   Smokeless tobacco: Never    Objective:    Vitals:   03/09/22 1539  BP: 130/88  Pulse: 106  Temp: 36.7 C (98 F)  SpO2: 98%  Weight: 92.5 kg (204 lb)  Height: 162.6 cm ('5\' 4"'$ )    Body mass index is 35.02 kg/m.  Gen: A&Ox3, no distress  Unlabored respirations RRR Abd soft, nontender, nondistended Skin: warm and dry   Labs, Imaging and Diagnostic Testing: See hpi  Assessment and Plan:  Diagnoses and all orders for this visit:  Calculus of gallbladder without cholecystitis without obstruction  Uterine leiomyoma, unspecified location  Adrenal mass, left (CMS-HCC) Comments: 94m likely adenoma, follow up imaging to be ordered by PCP  Obesity, unspecified classification, unspecified obesity type, unspecified whether serious comorbidity present    She does have a large gallstone at the gallbladder neck.  Given postprandial symptoms and upper abdominal pain specially in setting of negative upper endoscopy, I do think it is reasonable to proceed with laparoscopic cholecystectomy with empiric diagnosis of biliary colic/chronic cholecystitis. I recommend proceeding with laparoscopic or robotic cholecystectomy with possible cholangiogram.  Discussed the relevant anatomy using a diagram to demonstrate, and went over surgical technique.  Discussed risks of surgery including bleeding, pain, scarring, intraabdominal injury specifically to the common bile duct and sequelae, bile leak, conversion to open surgery, failure to resolve symptoms, blood clots/ pulmonary embolus, heart attack, pneumonia, stroke, death. Questions welcomed and answered to patient's satisfaction.  We will plan to proceed with scheduling at her convenience.  Antonisha Waskey ARaquel James MD

## 2022-03-09 NOTE — H&P (Signed)
Margaret Farrell [id number]   Referring Provider:  Syliva Overman   Subjective   Chief Complaint: New Consultation (Cholelithiasis)     History of Present Illness: Very pleasant 42yo woman with 3 of endometriosis, fibroids and ovarian cyst but no other medical problems who presents as a consultation from the ER where she presented on 12/14 with thoracic back pain radiating around to the sternum, muscle spasm quality as well as generalized abdominal pain of an aching quality. The latter has happened multiple times in the past but typically would resolve with emesis or bowel movement.  First occurrence was in June when she was on vacation on a cruise.  She states that the pain was across her entire upper abdomen radiating to her back.  When she had had multiple bowel movements and bouts of emesis and felt like she had completely emptied out her GI tract, the pain resolved.  She feels that the vasalva opponent of the vomiting seems to alleviate the pain as well.  She has had 3 or 4 more attacks, each lasting longer.  These do seem to be temporally related to eating higher fat meals.  The most recent episode occurred after eating chicken Parmesan, she ate around 6 and this woke her from sleep around midnight. She had an endoscopy by Dr. Paulita Fujita a few weeks ago, she reports that this was negative.  Records not available at this time.  Previous abdominal surgery includes laparoscopic ovarian cystectomy out 10 years ago.   Labs 02/01/22: CMP (AST/ALT 81/53, otherwise wnl); WBC 12/4, hb 15.7, plt 267  CT 02/01/22: IMPRESSION: 1. No acute finding. 2. Cholelithiasis and numerous uterine fibroids ("Extensive replacement of the uterus by lobulated masses, the largest measuring nearly 10 cm, consistent with fibroids. Similar size ovaries."). 3. 13 mm left adrenal nodule, statistically an adenoma. If patient has history of cancer, recommend further evaluation with adrenal protocol abdomen CT without and  with contrast. If no history of cancer, consider followup by CT in 12 months.  [place] 02/01/22:  FINDINGS: Gallbladder:   Dependent and shadowing 2 cm gallstone at the gallbladder neck. Minimally distended gallbladder. No wall thickening visualized. No sonographic Murphy sign noted by sonographer.   Common bile duct:   Diameter: 0.6 cm.  No intra-or extrahepatic biliary ductal dilation   Liver:   Within normal limits in parenchymal echogenicity.   Smooth contour liver.   Multifocal hepatic lesions during; 2.5 cm solid lesion of the gallbladder fossa likely consistent with hemangioma versus adenoma, and additional rounded anechoic lesions consistent with simple hepatic cysts   portal vein is patent on color Doppler imaging with normal direction of blood flow towards the liver.   Other: No perihepatic ascites.   IMPRESSION: Cholelithiasis with minimally distended gallbladder.   No additional sonographic findings to suggest acute cholecystitis. Review of Systems: A complete review of systems was obtained from the patient.  I have reviewed this information and discussed as appropriate with the patient.  See HPI as well for other ROS.   Medical History: History reviewed. No pertinent past medical history.  There is no problem list on file for this patient.   Past Surgical History:  Procedure Laterality Date   LAPAROSCOPIC OVARIAN CYSTECTOMY       Allergies  Allergen Reactions   Sulfa (Sulfonamide Antibiotics) Hives    Current Outpatient Medications on File Prior to Visit  Medication Sig Dispense Refill   norethindrone-e.estradioL-iron (LO LOESTRIN FE) 1 mg-10 mcg (24)/10 mcg (2)  No current facility-administered medications on file prior to visit.    Family History  Problem Relation Age of Onset   Hyperlipidemia (Elevated cholesterol) Mother    High blood pressure (Hypertension) Father    Hyperlipidemia (Elevated cholesterol) Father    Diabetes Father       Social History   Tobacco Use  Smoking Status Never  Smokeless Tobacco Never     Social History   Socioeconomic History   Marital status: Single  Tobacco Use   Smoking status: Never   Smokeless tobacco: Never    Objective:    Vitals:   03/09/22 1539  BP: 130/88  Pulse: 106  Temp: 36.7 C (98 F)  SpO2: 98%  Weight: 92.5 kg (204 lb)  Height: 162.6 cm ('5\' 4"'$ )    Body mass index is 35.02 kg/m.  Gen: A&Ox3, no distress  Unlabored respirations RRR Abd soft, nontender, nondistended Skin: warm and dry   Labs, Imaging and Diagnostic Testing: See hpi  Assessment and Plan:  Diagnoses and all orders for this visit:  Calculus of gallbladder without cholecystitis without obstruction  Uterine leiomyoma, unspecified location  Adrenal mass, left (CMS-HCC) Comments: 37m likely adenoma, follow up imaging to be ordered by PCP  Obesity, unspecified classification, unspecified obesity type, unspecified whether serious comorbidity present    She does have a large gallstone at the gallbladder neck.  Given postprandial symptoms and upper abdominal pain specially in setting of negative upper endoscopy, I do think it is reasonable to proceed with laparoscopic cholecystectomy with empiric diagnosis of biliary colic/chronic cholecystitis. I recommend proceeding with laparoscopic or robotic cholecystectomy with possible cholangiogram.  Discussed the relevant anatomy using a diagram to demonstrate, and went over surgical technique.  Discussed risks of surgery including bleeding, pain, scarring, intraabdominal injury specifically to the common bile duct and sequelae, bile leak, conversion to open surgery, failure to resolve symptoms, blood clots/ pulmonary embolus, heart attack, pneumonia, stroke, death. Questions welcomed and answered to patient's satisfaction.  We will plan to proceed with scheduling at her convenience.  Travontae Freiberger ARaquel James MD

## 2022-03-12 NOTE — Patient Instructions (Addendum)
SURGICAL WAITING ROOM VISITATION  Patients having surgery or a procedure may have no more than 2 support people in the waiting area - these visitors may rotate.    Children under the age of 23 must have an adult with them who is not the patient.  Due to an increase in RSV and influenza rates and associated hospitalizations, children ages 43 and under may not visit patients in [place].  If the patient needs to stay at the hospital during part of their recovery, the visitor guidelines for inpatient rooms apply. Pre-op nurse will coordinate an appropriate time for 1 support person to accompany patient in pre-op.  This support person may not rotate.    Please refer to the [facility] website for the visitor guidelines for Inpatients (after your surgery is over and you are in a regular room).       Your procedure is scheduled on:  03/20/2022    Report to [facility] Main Entrance    Report to admitting at   1115AM   Call this number if you have problems the morning of surgery [phone]   Do not eat food or drink liquids  :After Midnight.                            If you have questions, please contact your surgeon's office.     Oral Hygiene is also important to reduce your risk of infection.                                    Remember - BRUSH YOUR TEETH THE MORNING OF SURGERY WITH YOUR REGULAR TOOTHPASTE  DENTURES WILL BE REMOVED PRIOR TO SURGERY PLEASE DO NOT APPLY "Poly grip" OR ADHESIVES!!!   Do NOT smoke after Midnight   Take these medicines the morning of surgery with A SIP OF WATER:   none   DO NOT TAKE ANY ORAL DIABETIC MEDICATIONS DAY OF YOUR SURGERY  Bring CPAP mask and tubing day of surgery.                              You may not have any metal on your body including hair pins, jewelry, and body piercing             Do not wear make-up, lotions, powders, perfumes/cologne, or deodorant  Do not wear nail polish including gel and S&S,  artificial/acrylic nails, or any other type of covering on natural nails including finger and toenails. If you have artificial nails, gel coating, etc. that needs to be removed by a nail salon please have this removed prior to surgery or surgery may need to be canceled/ delayed if the surgeon/ anesthesia feels like they are unable to be safely monitored.   Do not shave  48 hours prior to surgery.               Men may shave face and neck.   Do not bring valuables to the hospital. [place].   Contacts, glasses, dentures or bridgework may not be worn into surgery.   Bring small overnight bag day of surgery.   DO NOT [place]. PHARMACY WILL DISPENSE  MEDICATIONS LISTED ON YOUR MEDICATION LIST TO YOU DURING YOUR ADMISSION [place]!    Patients discharged on the day of surgery will not be allowed to drive home.  Someone NEEDS to stay with you for the first 24 hours after anesthesia.   Special Instructions: Bring a copy of your healthcare power of attorney and living will documents the day of surgery if you haven't scanned them before.              Please read over the following fact sheets you were given: IF [place] [phone]   If you received a COVID test during your pre-op visit  it is requested that you wear a mask when out in public, stay away from anyone that may not be feeling well and notify your surgeon if you develop symptoms. If you test positive for Covid or have been in contact with anyone that has tested positive in the last 10 days please notify you surgeon.    [place] - Preparing for Surgery Before surgery, you can play an important role.  Because skin is not sterile, your skin needs to be as free of germs as possible.  You can reduce the number of germs on your skin by washing with CHG (chlorahexidine gluconate) soap before  surgery.  CHG is an antiseptic cleaner which kills germs and bonds with the skin to continue killing germs even after washing. Please DO NOT use if you have an allergy to CHG or antibacterial soaps.  If your skin becomes reddened/irritated stop using the CHG and inform your nurse when you arrive at Short Stay. Do not shave (including legs and underarms) for at least 48 hours prior to the first CHG shower.  You may shave your face/neck. Please follow these instructions carefully:  1.  Shower with CHG Soap the night before surgery and the  morning of Surgery.  2.  If you choose to wash your hair, wash your hair first as usual with your  normal  shampoo.  3.  After you shampoo, rinse your hair and body thoroughly to remove the  shampoo.                           4.  Use CHG as you would any other liquid soap.  You can apply chg directly  to the skin and wash                       Gently with a scrungie or clean washcloth.  5.  Apply the CHG Soap to your body ONLY FROM THE NECK DOWN.   Do not use on face/ open                           Wound or open sores. Avoid contact with eyes, ears mouth and genitals (private parts).                       Wash face,  Genitals (private parts) with your normal soap.             6.  Wash thoroughly, paying special attention to the area where your surgery  will be performed.  7.  Thoroughly rinse your body with warm water from the neck down.  8.  DO NOT shower/wash with your normal soap after using and rinsing  off  the CHG Soap.                9.  Pat yourself dry with a clean towel.            10.  Wear clean pajamas.            11.  Place clean sheets on your bed the night of your first shower and do not  sleep with pets. Day of Surgery : Do not apply any lotions/deodorants the morning of surgery.  Please wear clean clothes to the hospital/surgery center.  FAILURE TO FOLLOW THESE INSTRUCTIONS MAY RESULT IN THE CANCELLATION OF YOUR SURGERY PATIENT  SIGNATURE_________________________________  NURSE SIGNATURE__________________________________  ________________________________________________________________________

## 2022-03-12 NOTE — Progress Notes (Signed)
Anesthesia Review:  PCP: Cardiologist : Chest x-ray : EKG : Echo : Stress test: Cardiac Cath :  Activity level:  Sleep Study/ CPAP : Fasting Blood Sugar :      / Checks Blood Sugar -- times a day:   Blood Thinner/ Instructions /Last Dose: ASA / Instructions/ Last Dose :  

## 2022-03-15 ENCOUNTER — Encounter (HOSPITAL_COMMUNITY)
Admission: RE | Admit: 2022-03-15 | Discharge: 2022-03-15 | Disposition: A | Discharge status: Home / Self Care | Payer: BC Managed Care – PPO | Source: Ambulatory Visit | Attending: Surgery | Admitting: Surgery

## 2022-03-15 ENCOUNTER — Other Ambulatory Visit: Payer: Self-pay

## 2022-03-15 ENCOUNTER — Encounter (HOSPITAL_COMMUNITY): Payer: Self-pay

## 2022-03-15 VITALS — BP 134/90 | HR 96 | Temp 99.5°F | Resp 16 | Ht 64.0 in | Wt 200.0 lb

## 2022-03-15 DIAGNOSIS — Z01812 Encounter for preprocedural laboratory examination: Secondary | ICD-10-CM | POA: Diagnosis not present

## 2022-03-15 DIAGNOSIS — Z01818 Encounter for other preprocedural examination: Secondary | ICD-10-CM

## 2022-03-15 HISTORY — DX: Endometriosis, unspecified: N80.9

## 2022-03-15 LAB — CBC
HCT: 43.8 % (ref 36.0–46.0)
Hemoglobin: 14.4 g/dL (ref 12.0–15.0)
MCH: 29 pg (ref 26.0–34.0)
MCHC: 32.9 g/dL (ref 30.0–36.0)
MCV: 88.1 fL (ref 80.0–100.0)
Platelets: 255 10*3/uL (ref 150–400)
RBC: 4.97 MIL/uL (ref 3.87–5.11)
RDW: 12.9 % (ref 11.5–15.5)
WBC: 4.1 10*3/uL (ref 4.0–10.5)
nRBC: 0 % (ref 0.0–0.2)

## 2022-03-20 ENCOUNTER — Encounter (HOSPITAL_COMMUNITY): Payer: Self-pay | Admitting: Surgery

## 2022-03-20 ENCOUNTER — Ambulatory Visit (HOSPITAL_COMMUNITY)
Admission: RE | Admit: 2022-03-20 | Discharge: 2022-03-20 | Disposition: A | Discharge status: Home / Self Care | Payer: BC Managed Care – PPO | Attending: Surgery | Admitting: Surgery

## 2022-03-20 ENCOUNTER — Ambulatory Visit (HOSPITAL_COMMUNITY): Payer: BC Managed Care – PPO | Admitting: Anesthesiology

## 2022-03-20 ENCOUNTER — Ambulatory Visit (HOSPITAL_COMMUNITY): Payer: BC Managed Care – PPO | Admitting: Physician Assistant

## 2022-03-20 ENCOUNTER — Other Ambulatory Visit: Payer: Self-pay

## 2022-03-20 ENCOUNTER — Encounter (HOSPITAL_COMMUNITY)
Admission: RE | Disposition: A | Discharge status: Home / Self Care | Payer: Self-pay | Source: Home / Self Care | Attending: Surgery

## 2022-03-20 DIAGNOSIS — K8010 Calculus of gallbladder with chronic cholecystitis without obstruction: Secondary | ICD-10-CM | POA: Insufficient documentation

## 2022-03-20 DIAGNOSIS — Z01818 Encounter for other preprocedural examination: Secondary | ICD-10-CM

## 2022-03-20 HISTORY — PX: CHOLECYSTECTOMY: SHX55

## 2022-03-20 LAB — POCT I-STAT, CHEM 8
BUN: 14 mg/dL (ref 6–20)
Calcium, Ion: 0.92 mmol/L — ABNORMAL LOW (ref 1.15–1.40)
Chloride: 110 mmol/L (ref 98–111)
Creatinine, Ser: 0.7 mg/dL (ref 0.44–1.00)
Glucose, Bld: 96 mg/dL (ref 70–99)
HCT: 49 % — ABNORMAL HIGH (ref 36.0–46.0)
Hemoglobin: 16.7 g/dL — ABNORMAL HIGH (ref 12.0–15.0)
Potassium: 5.4 mmol/L — ABNORMAL HIGH (ref 3.5–5.1)
Sodium: 137 mmol/L (ref 135–145)
TCO2: 23 mmol/L (ref 22–32)

## 2022-03-20 LAB — POCT PREGNANCY, URINE: Preg Test, Ur: NEGATIVE

## 2022-03-20 SURGERY — LAPAROSCOPIC CHOLECYSTECTOMY
Anesthesia: General

## 2022-03-20 MED ORDER — SUGAMMADEX SODIUM 200 MG/2ML IV SOLN
INTRAVENOUS | Status: DC | PRN
  Administered 2022-03-20: 200 mg via INTRAVENOUS

## 2022-03-20 MED ORDER — LIDOCAINE HCL (CARDIAC) PF 100 MG/5ML IV SOSY
PREFILLED_SYRINGE | INTRAVENOUS | Status: DC | PRN
  Administered 2022-03-20: 100 mg via INTRAVENOUS

## 2022-03-20 MED ORDER — TRAMADOL HCL 50 MG PO TABS: 50.0000 mg | ORAL_TABLET | Freq: Four times a day (QID) | ORAL | 0 refills | Status: AC | PRN

## 2022-03-20 MED ORDER — ROCURONIUM BROMIDE 100 MG/10ML IV SOLN
INTRAVENOUS | Status: DC | PRN
  Administered 2022-03-20: 10 mg via INTRAVENOUS
  Administered 2022-03-20: 50 mg via INTRAVENOUS

## 2022-03-20 MED ORDER — ONDANSETRON HCL 4 MG/2ML IJ SOLN
INTRAMUSCULAR | Status: DC | PRN
  Administered 2022-03-20: 4 mg via INTRAVENOUS

## 2022-03-20 MED ORDER — FENTANYL CITRATE PF 50 MCG/ML IJ SOSY: 25.0000 ug | PREFILLED_SYRINGE | INTRAMUSCULAR | Status: DC | PRN

## 2022-03-20 MED ORDER — PROMETHAZINE HCL 25 MG/ML IJ SOLN: 6.2500 mg | INTRAMUSCULAR | Status: DC | PRN

## 2022-03-20 MED ORDER — CEFAZOLIN SODIUM-DEXTROSE 2-4 GM/100ML-% IV SOLN
2.0000 g | INTRAVENOUS | Status: AC
  Administered 2022-03-20: 2 g via INTRAVENOUS
  Filled 2022-03-20: qty 100

## 2022-03-20 MED ORDER — BUPIVACAINE LIPOSOME 1.3 % IJ SUSP
INTRAMUSCULAR | Status: DC | PRN
  Administered 2022-03-20: 50 mL

## 2022-03-20 MED ORDER — MIDAZOLAM HCL 2 MG/2ML IJ SOLN
INTRAMUSCULAR | Status: AC
  Filled 2022-03-20: qty 2

## 2022-03-20 MED ORDER — CHLORHEXIDINE GLUCONATE 0.12 % MT SOLN
15.0000 mL | Freq: Once | OROMUCOSAL | Status: AC
  Administered 2022-03-20: 15 mL via OROMUCOSAL

## 2022-03-20 MED ORDER — ONDANSETRON HCL 4 MG/2ML IJ SOLN
INTRAMUSCULAR | Status: AC
  Filled 2022-03-20: qty 2

## 2022-03-20 MED ORDER — FENTANYL CITRATE (PF) 100 MCG/2ML IJ SOLN
INTRAMUSCULAR | Status: DC | PRN
  Administered 2022-03-20 (×2): 50 ug via INTRAVENOUS
  Administered 2022-03-20 (×2): 25 ug via INTRAVENOUS
  Administered 2022-03-20: 100 ug via INTRAVENOUS

## 2022-03-20 MED ORDER — DEXAMETHASONE SODIUM PHOSPHATE 10 MG/ML IJ SOLN
INTRAMUSCULAR | Status: DC | PRN
  Administered 2022-03-20: 8 mg via INTRAVENOUS

## 2022-03-20 MED ORDER — OXYCODONE HCL 5 MG PO TABS: 5.0000 mg | ORAL_TABLET | ORAL | Status: DC | PRN

## 2022-03-20 MED ORDER — PROPOFOL 10 MG/ML IV BOLUS
INTRAVENOUS | Status: AC
  Filled 2022-03-20: qty 20

## 2022-03-20 MED ORDER — ACETAMINOPHEN 650 MG RE SUPP: 650.0000 mg | RECTAL | Status: DC | PRN

## 2022-03-20 MED ORDER — KETOROLAC TROMETHAMINE 30 MG/ML IJ SOLN: 30.0000 mg | Freq: Once | INTRAMUSCULAR | Status: DC | PRN

## 2022-03-20 MED ORDER — BUPIVACAINE LIPOSOME 1.3 % IJ SUSP: 20.0000 mL | Freq: Once | INTRAMUSCULAR | Status: DC

## 2022-03-20 MED ORDER — PROPOFOL 10 MG/ML IV BOLUS
INTRAVENOUS | Status: DC | PRN
  Administered 2022-03-20: 200 mg via INTRAVENOUS

## 2022-03-20 MED ORDER — FENTANYL CITRATE (PF) 250 MCG/5ML IJ SOLN
INTRAMUSCULAR | Status: AC
  Filled 2022-03-20: qty 5

## 2022-03-20 MED ORDER — LIDOCAINE HCL (PF) 2 % IJ SOLN
INTRAMUSCULAR | Status: AC
  Filled 2022-03-20: qty 5

## 2022-03-20 MED ORDER — LACTATED RINGERS IV SOLN: INTRAVENOUS | Status: DC

## 2022-03-20 MED ORDER — MIDAZOLAM HCL 5 MG/5ML IJ SOLN
INTRAMUSCULAR | Status: DC | PRN
  Administered 2022-03-20: 2 mg via INTRAVENOUS

## 2022-03-20 MED ORDER — ACETAMINOPHEN 500 MG PO TABS
1000.0000 mg | ORAL_TABLET | ORAL | Status: AC
  Administered 2022-03-20: 1000 mg via ORAL
  Filled 2022-03-20: qty 2

## 2022-03-20 MED ORDER — SCOPOLAMINE 1 MG/3DAYS TD PT72
1.0000 | MEDICATED_PATCH | TRANSDERMAL | Status: DC
  Administered 2022-03-20: 1.5 mg via TRANSDERMAL
  Filled 2022-03-20: qty 1

## 2022-03-20 MED ORDER — PHENYLEPHRINE HCL (PRESSORS) 10 MG/ML IV SOLN
INTRAVENOUS | Status: DC | PRN
  Administered 2022-03-20: 80 ug via INTRAVENOUS

## 2022-03-20 MED ORDER — DOCUSATE SODIUM 100 MG PO CAPS: 100.0000 mg | ORAL_CAPSULE | Freq: Two times a day (BID) | ORAL | 0 refills | Status: AC

## 2022-03-20 MED ORDER — LACTATED RINGERS IR SOLN
Status: DC | PRN
  Administered 2022-03-20: 1000 mL

## 2022-03-20 MED ORDER — GABAPENTIN 300 MG PO CAPS
300.0000 mg | ORAL_CAPSULE | ORAL | Status: AC
  Administered 2022-03-20: 300 mg via ORAL
  Filled 2022-03-20: qty 1

## 2022-03-20 MED ORDER — BUPIVACAINE LIPOSOME 1.3 % IJ SUSP
INTRAMUSCULAR | Status: AC
  Filled 2022-03-20: qty 20

## 2022-03-20 MED ORDER — OXYCODONE HCL 5 MG/5ML PO SOLN: 5.0000 mg | Freq: Once | ORAL | Status: DC | PRN

## 2022-03-20 MED ORDER — SODIUM CHLORIDE 0.9% FLUSH: 3.0000 mL | Freq: Two times a day (BID) | INTRAVENOUS | Status: DC

## 2022-03-20 MED ORDER — SODIUM CHLORIDE 0.9% FLUSH: 3.0000 mL | INTRAVENOUS | Status: DC | PRN

## 2022-03-20 MED ORDER — DEXAMETHASONE SODIUM PHOSPHATE 10 MG/ML IJ SOLN
INTRAMUSCULAR | Status: AC
  Filled 2022-03-20: qty 1

## 2022-03-20 MED ORDER — ORAL CARE MOUTH RINSE: 15.0000 mL | Freq: Once | OROMUCOSAL | Status: AC

## 2022-03-20 MED ORDER — CHLORHEXIDINE GLUCONATE 4 % EX LIQD: 60.0000 mL | Freq: Once | CUTANEOUS | Status: DC

## 2022-03-20 MED ORDER — PHENYLEPHRINE 80 MCG/ML (10ML) SYRINGE FOR IV PUSH (FOR BLOOD PRESSURE SUPPORT)
PREFILLED_SYRINGE | INTRAVENOUS | Status: AC
  Filled 2022-03-20: qty 10

## 2022-03-20 MED ORDER — ACETAMINOPHEN 325 MG PO TABS: 650.0000 mg | ORAL_TABLET | ORAL | Status: DC | PRN

## 2022-03-20 MED ORDER — ROCURONIUM BROMIDE 10 MG/ML (PF) SYRINGE
PREFILLED_SYRINGE | INTRAVENOUS | Status: AC
  Filled 2022-03-20: qty 10

## 2022-03-20 MED ORDER — OXYCODONE HCL 5 MG PO TABS: 5.0000 mg | ORAL_TABLET | Freq: Once | ORAL | Status: DC | PRN

## 2022-03-20 MED ORDER — BUPIVACAINE HCL 0.25 % IJ SOLN
INTRAMUSCULAR | Status: AC
  Filled 2022-03-20: qty 1

## 2022-03-20 MED ORDER — 0.9 % SODIUM CHLORIDE (POUR BTL) OPTIME
TOPICAL | Status: DC | PRN
  Administered 2022-03-20: 1000 mL

## 2022-03-20 MED ORDER — SODIUM CHLORIDE 0.9 % IV SOLN: 250.0000 mL | INTRAVENOUS | Status: DC | PRN

## 2022-03-20 SURGICAL SUPPLY — 40 items
APPLIER CLIP ROT 10 11.4 M/L (STAPLE) ×1
BAG COUNTER SPONGE SURGICOUNT (BAG) IMPLANT
CABLE HIGH FREQUENCY MONO STRZ (ELECTRODE) ×1 IMPLANT
CHLORAPREP W/TINT 26 (MISCELLANEOUS) ×1 IMPLANT
CLIP APPLIE ROT 10 11.4 M/L (STAPLE) ×1 IMPLANT
COVER MAYO STAND STRL (DRAPES) IMPLANT
COVER SURGICAL LIGHT HANDLE (MISCELLANEOUS) ×1 IMPLANT
DERMABOND ADVANCED .7 DNX12 (GAUZE/BANDAGES/DRESSINGS) ×1 IMPLANT
DRAPE C-ARM 42X120 X-RAY (DRAPES) IMPLANT
ELECT REM PT RETURN 15FT ADLT (MISCELLANEOUS) ×1 IMPLANT
GLOVE BIO SURGEON STRL SZ 6 (GLOVE) ×1 IMPLANT
GLOVE INDICATOR 6.5 STRL GRN (GLOVE) ×1 IMPLANT
GLOVE SS BIOGEL STRL SZ 6 (GLOVE) ×1 IMPLANT
GOWN STRL REUS W/ TWL LRG LVL3 (GOWN DISPOSABLE) ×1 IMPLANT
GOWN STRL REUS W/ TWL XL LVL3 (GOWN DISPOSABLE) IMPLANT
GOWN STRL REUS W/TWL LRG LVL3 (GOWN DISPOSABLE) ×1
GOWN STRL REUS W/TWL XL LVL3 (GOWN DISPOSABLE)
GRASPER SUT TROCAR 14GX15 (MISCELLANEOUS) ×1 IMPLANT
HEMOSTAT SNOW SURGICEL 2X4 (HEMOSTASIS) IMPLANT
IRRIG SUCT STRYKERFLOW 2 WTIP (MISCELLANEOUS) ×1
IRRIGATION SUCT STRKRFLW 2 WTP (MISCELLANEOUS) ×1 IMPLANT
KIT BASIN OR (CUSTOM PROCEDURE TRAY) ×1 IMPLANT
KIT TURNOVER KIT A (KITS) IMPLANT
NDL INSUFFLATION 14GA 120MM (NEEDLE) ×1 IMPLANT
NEEDLE INSUFFLATION 14GA 120MM (NEEDLE) ×1 IMPLANT
PENCIL SMOKE EVACUATOR (MISCELLANEOUS) IMPLANT
SCISSORS LAP 5X35 DISP (ENDOMECHANICALS) ×1 IMPLANT
SET CHOLANGIOGRAPH MIX (MISCELLANEOUS) IMPLANT
SET TUBE SMOKE EVAC HIGH FLOW (TUBING) ×1 IMPLANT
SLEEVE Z-THREAD 5X100MM (TROCAR) ×1 IMPLANT
SPIKE FLUID TRANSFER (MISCELLANEOUS) ×1 IMPLANT
SUT MNCRL AB 4-0 PS2 18 (SUTURE) ×1 IMPLANT
SYS BAG RETRIEVAL 10MM (BASKET) ×1
SYSTEM BAG RETRIEVAL 10MM (BASKET) ×1 IMPLANT
TOWEL OR 17X26 10 PK STRL BLUE (TOWEL DISPOSABLE) ×1 IMPLANT
TOWEL OR NON WOVEN STRL DISP B (DISPOSABLE) IMPLANT
TRAY LAPAROSCOPIC (CUSTOM PROCEDURE TRAY) ×1 IMPLANT
TROCAR ADV FIXATION 12X100MM (TROCAR) ×1 IMPLANT
TROCAR XCEL NON-BLD 5MMX100MML (ENDOMECHANICALS) IMPLANT
TROCAR Z-THREAD OPTICAL 5X100M (TROCAR) ×1 IMPLANT

## 2022-03-20 NOTE — Interval H&P Note (Signed)
History and Physical Interval Note:  03/20/2022 10:59 AM  Margaret Farrell  has presented today for surgery, with the diagnosis of BILIARY COLIC.  The various methods of treatment have been discussed with the patient and family. After consideration of risks, benefits and other options for treatment, the patient has consented to  Procedure(s): LAPAROSCOPIC CHOLECYSTECTOMY (N/A) as a surgical intervention.  The patient's history has been reviewed, patient examined, no change in status, stable for surgery.  I have reviewed the patient's chart and labs.  Questions were answered to the patient's satisfaction.     [name]

## 2022-03-20 NOTE — Anesthesia Preprocedure Evaluation (Addendum)
Anesthesia Evaluation  Patient identified by MRN, date of birth, ID band Patient awake    Reviewed: Allergy & Precautions, NPO status , Patient's Chart, lab work & pertinent test results  History of Anesthesia Complications Negative for: history of anesthetic complications  Airway Mallampati: III  TM Distance: >3 FB Neck ROM: Full    Dental  (+) Teeth Intact, Dental Advisory Given   Pulmonary neg pulmonary ROS   Pulmonary exam normal        Cardiovascular negative cardio ROS  Rhythm:Regular Rate:Normal     Neuro/Psych negative neurological ROS     GI/Hepatic negative GI ROS, Neg liver ROS,,,  Endo/Other  negative endocrine ROS    Renal/GU negative Renal ROS     Musculoskeletal   Abdominal  (+) + obese  Peds  Hematology negative hematology ROS (+)   Anesthesia Other Findings   Reproductive/Obstetrics endometriosis                             Anesthesia Physical Anesthesia Plan  ASA: 2  Anesthesia Plan: General   Post-op Pain Management: Tylenol PO (pre-op)*   Induction: Intravenous  PONV Risk Score and Plan: 3 and Ondansetron, Dexamethasone, Treatment may vary due to age or medical condition and Scopolamine patch - Pre-op  Airway Management Planned: Oral ETT  Additional Equipment:   Intra-op Plan:   Post-operative Plan: Extubation in OR  Informed Consent: I have reviewed the patients History and Physical, chart, labs and discussed the procedure including the risks, benefits and alternatives for the proposed anesthesia with the patient or authorized representative who has indicated his/her understanding and acceptance.     Dental advisory given  Plan Discussed with: CRNA and Anesthesiologist  Anesthesia Plan Comments: (Risks of general anesthesia discussed including, but not limited to, sore throat, hoarse voice, chipped/damaged teeth, injury to vocal cords, nausea and  vomiting, allergic reactions, lung infection, heart attack, stroke, and death. All questions answered. )        Anesthesia Quick Evaluation

## 2022-03-20 NOTE — Transfer of Care (Signed)
Immediate Anesthesia Transfer of Care Note  Patient: Tynisha Recinos  Procedure(s) Performed: LAPAROSCOPIC CHOLECYSTECTOMY  Patient Location: PACU  Anesthesia Type:General  Level of Consciousness: awake, alert , oriented, and patient cooperative  Airway & Oxygen Therapy: Patient Spontanous Breathing and Patient connected to face mask oxygen  Post-op Assessment: Report given to RN and Post -op Vital signs reviewed and stable  Post vital signs: Reviewed and stable  Last Vitals:  Vitals Value Taken Time  BP 139/93 03/20/22 1250  Temp 36.4 C 03/20/22 1250  Pulse 103 03/20/22 1252  Resp 23 03/20/22 1252  SpO2 100 % 03/20/22 1252  Vitals shown include unvalidated device data.  Last Pain:  Vitals:   03/20/22 1007  TempSrc:   PainSc: 0-No pain      Patients Stated Pain Goal: 4 (01/77/93 9030)  Complications: No notable events documented.

## 2022-03-20 NOTE — Anesthesia Postprocedure Evaluation (Signed)
Anesthesia Post Note  Patient: Margaret Farrell  Procedure(s) Performed: LAPAROSCOPIC CHOLECYSTECTOMY     Patient location during evaluation: PACU Anesthesia Type: General Level of consciousness: awake Pain management: pain level controlled Vital Signs Assessment: post-procedure vital signs reviewed and stable Respiratory status: spontaneous breathing, nonlabored ventilation and respiratory function stable Cardiovascular status: blood pressure returned to baseline and stable Postop Assessment: no apparent nausea or vomiting Anesthetic complications: no   No notable events documented.  Last Vitals:  Vitals:   03/20/22 1300 03/20/22 1334  BP: (!) 141/105 (!) 143/89  Pulse: (!) 101 80  Resp: 15 12  Temp:  (!) 36.4 C  SpO2: 96% 95%    Last Pain:  Vitals:   03/20/22 1334  TempSrc:   PainSc: 0-No pain                 Nilda Simmer

## 2022-03-20 NOTE — Anesthesia Procedure Notes (Signed)
Procedure Name: Intubation Date/Time: 03/20/2022 11:37 AM  Performed by: Niel Hummer, CRNAPre-anesthesia Checklist: Patient identified, Emergency Drugs available, Suction available and Patient being monitored Patient Re-evaluated:Patient Re-evaluated prior to induction Oxygen Delivery Method: Circle system utilized Preoxygenation: Pre-oxygenation with 100% oxygen Induction Type: IV induction Ventilation: Mask ventilation without difficulty Laryngoscope Size: Mac and 3 Grade View: Grade I Tube type: Oral Tube size: 7.0 mm Number of attempts: 1 Airway Equipment and Method: Stylet Placement Confirmation: ETT inserted through vocal cords under direct vision, positive ETCO2 and breath sounds checked- equal and bilateral Secured at: 22 cm Tube secured with: Tape Dental Injury: Teeth and Oropharynx as per pre-operative assessment

## 2022-03-20 NOTE — Op Note (Signed)
Operative Note  Lulamae Skorupski 42 y.o. female [id number]  03/20/2022  Surgeon: Clovis Riley MD FACS  Assistant: Eben Burow, MD ([id number]) I was personally present during the key and critical portions of this procedure and immediately available throughout the entire procedure, as documented in my operative note.   Procedure performed: Laparoscopic Cholecystectomy  Preop diagnosis: biliary colic Post-op diagnosis/intraop findings: same  Specimens: gallbladder  Retained items: none  EBL: minimal  Complications: none  Description of procedure: After obtaining informed consent the patient was brought to the operating room. Antibiotics were administered. SCD's were applied. General endotracheal anesthesia was initiated and a formal time-out was performed. The abdomen was prepped and draped in the usual sterile fashion and the abdomen was entered using an infraumbilical veress needle after instilling the site with local. Insufflation to 9mHg was obtained, 598mtrocar and camera inserted, and gross inspection revealed no evidence of injury from our entry. Two 69m76mrocars were introduced in the right midclavicular and right anterior axillary lines under direct visualization and following infiltration with local. A 65m68mocar was placed in the epigastrium. The gallbladder was retracted cephalad and the infundibulum was retracted laterally. A combination of hook electrocautery and blunt dissection was utilized to clear the peritoneum from the neck and cystic duct, circumferentially isolating the cystic artery and cystic duct and lifting the gallbladder from the cystic plate. The critical view of safety was achieved with the cystic artery, cystic duct, and liver bed visualized between them with no other structures. The artery was clipped with 2 clips proximally and one distally and divided as was the cystic duct with three clips on the proximal end. The gallbladder was dissected from the  liver plate using electrocautery.  There was a small posterior cystic artery branch which was clipped singly before dividing distally with cautery.  Once freed the gallbladder was placed in an endocatch bag and removed through the epigastric trocar site. A small amount of bleeding on the liver bed was controlled with cautery. Some bile and small stones had been spilled from the gallbladder during its dissection from the liver bed. This was all retrieved using the suction irrigator and the right upper quadrant was irrigated copiously; the effluent was clear. Hemostasis was once again confirmed, and reinspection of the abdomen revealed no injuries. The clips were well opposed without any bile leak from the duct or the liver bed. The 65mm72mcar site in the epigastrium was closed with a 0 vicryl in the fascia under direct visualization using a PMI device. The abdomen was desufflated and all trocars removed. The skin incisions were closed with subcuticular 4-0 monocryl and Dermabond. The patient was awakened, extubated and transported to the recovery room in stable condition.    All counts were correct at the completion of the case.

## 2022-03-20 NOTE — Discharge Instructions (Signed)
LAPAROSCOPIC SURGERY: POST OP INSTRUCTIONS   EAT Gradually transition to a high fiber diet with a fiber supplement over the next few weeks after discharge.  Start with a pureed / full liquid diet (see below)  WALK Walk an hour a day (cumulative- not all at once).  Control your pain to do that.    CONTROL PAIN Control pain so that you can walk, sleep, tolerate sneezing/coughing, go up/down stairs.  HAVE A BOWEL MOVEMENT DAILY Keep your bowels regular to avoid problems.  OK to try a laxative to override constipation.  OK to use an antidiarrheal to slow down diarrhea.  Call if not better after 2 tries  CALL IF YOU HAVE PROBLEMS/CONCERNS Call if you are still struggling despite following these instructions. Call if you have concerns not answered by these instructions    DIET: Follow a light bland diet & liquids the first 24 hours after arrival home, such as soup, liquids, starches, etc.  Be sure to drink plenty of fluids.  Quickly advance to a usual solid diet within a few days.  Avoid fast food or heavy meals initially as you are more likely to get nauseated or have irregular bowels.  A low-sugar, high-fiber diet for the rest of your life is ideal.  Take your usually prescribed home medications unless otherwise directed.  PAIN CONTROL: Pain is best controlled by a usual combination of three different methods TOGETHER: Ice/Heat Over the counter pain medication Prescription pain medication Most patients will experience some swelling and bruising around the incisions.  Ice packs or heating pads (30-60 minutes up to 6 times a day) will help. Use ice for the first few days to help decrease swelling and bruising, then switch to heat to help relax tight/sore spots and speed recovery.  Some people prefer to use ice alone, heat alone, alternating between ice & heat.  Experiment to what works for you.  Swelling and bruising can take several weeks to resolve.   It is helpful to take an  over-the-counter pain medication regularly for the first few days: Naproxen (Aleve, etc)  Two 220m tabs twice a day OR Ibuprofen (Advil, etc) Three 2090mtabs four times a day (every meal & bedtime) AND Acetaminophen (Tylenol, etc) 500-65074mour times a day (every meal & bedtime) A  prescription for pain medication (such as oxycodone, hydrocodone, tramadol, gabapentin, methocarbamol, etc) should be given to you upon discharge.  Take your pain medication as prescribed, IF NEEDED.  If you are having problems/concerns with the prescription medicine (does not control pain, nausea, vomiting, rash, itching, etc), please call us [phone] see if we need to switch you to a different pain medicine that will work better for you and/or control your side effect better. If you need a refill on your pain medication, please give us [place] hour notice.  contact your pharmacy.  They will contact our office to request authorization. Prescriptions will not be filled after 5 pm or on week-ends  Avoid getting constipated.   Between the surgery and the pain medications, it is common to experience some constipation.   Increasing fluid intake and taking a fiber supplement (such as Metamucil, Citrucel, FiberCon, MiraLax, etc) 1-2 times a day regularly will usually help prevent this problem from occurring.   A mild laxative (prune juice, Milk of Magnesia, MiraLax, etc) should be taken according to package directions if there are no bowel movements after 48 hours.   Watch out for diarrhea.   If you have many  loose bowel movements, simplify your diet to bland foods & liquids for a few days.   Stop any stool softeners and decrease your fiber supplement.   Switching to mild anti-diarrheal medications (Kayopectate, Pepto Bismol) can help.   If this worsens or does not improve, please call us.  Wash / shower every day.  You may shower over the skin glue which is waterproof.  Do not soak or submerge incisions.  No rubbing,  scrubbing, lotions or ointments to incisions.  Glue will flake off after about 2 weeks.  You may leave the incision open to air.  You may replace a dressing/Band-Aid to cover the incision for comfort if you wish.   ACTIVITIES as tolerated:   You may resume regular (light) daily activities beginning the next day--such as daily self-care, walking, climbing stairs--gradually increasing activities as tolerated.  If you can walk 30 minutes without difficulty, it is safe to try more intense activity such as jogging, treadmill, bicycling, low-impact aerobics, swimming, etc. Save the most intensive and strenuous activity for last such as sit-ups, heavy lifting, contact sports, etc  Refrain from any heavy lifting or straining until you are off narcotics for pain control.   DO NOT PUSH THROUGH PAIN.  Let pain be your guide: If it hurts to do something, don't do it.  Pain is your body warning you to avoid that activity for another week until the pain goes down. You may drive when you are no longer taking prescription pain medication, you can comfortably wear a seatbelt, and you can safely maneuver your car and apply brakes. You may have sexual intercourse when it is comfortable.  FOLLOW UP in our office Please call [facility] at [phone] to set up an appointment to see your surgeon in the office for a follow-up appointment approximately 2-3 weeks after your surgery. Make sure that you call for this appointment the day you arrive home to insure a convenient appointment time.  10. IF YOU HAVE DISABILITY OR FAMILY LEAVE FORMS, BRING THEM TO THE OFFICE FOR PROCESSING.  DO NOT GIVE THEM TO YOUR DOCTOR.   WHEN TO CALL us [phone]: Poor pain control Reactions / problems with new medications (rash/itching, nausea, etc)  Fever over 101.5 F (38.5 C) Inability to urinate Nausea and/or vomiting Worsening swelling or bruising Continued bleeding from incision. Increased pain, redness, or drainage from the  incision   The clinic staff is available to answer your questions during regular business hours (8:30am-5pm).  Please don't hesitate to call and ask to speak to one of our nurses for clinical concerns.   If you have a medical emergency, go to the nearest emergency room or call 911.  A surgeon from [facility] is always on call at the [facility], [place], [place], [place], [place]  [address] ? MAIN: [phone] ? TOLL FREE: [phone] ?  FAX [phone] [email or web address]

## 2022-03-21 ENCOUNTER — Encounter (HOSPITAL_COMMUNITY): Payer: Self-pay | Admitting: Surgery

## 2022-03-22 LAB — SURGICAL PATHOLOGY

## 2022-04-25 ENCOUNTER — Ambulatory Visit (INDEPENDENT_AMBULATORY_CARE_PROVIDER_SITE_OTHER): Payer: BC Managed Care – PPO

## 2022-04-25 DIAGNOSIS — M722 Plantar fascial fibromatosis: Secondary | ICD-10-CM

## 2022-04-25 NOTE — Progress Notes (Signed)
Patient presents today to pick up custom molded foot orthotics recommended by Dr. Paulla Dolly.   Orthotics were dispensed and fit was satisfactory. Reviewed instructions for break-in and wear. Written instructions given to patient.  Patient will follow up as needed.   [facility] - order # [id number]

## 2022-05-02 ENCOUNTER — Encounter: Payer: Self-pay | Admitting: Podiatry

## 2022-05-02 ENCOUNTER — Ambulatory Visit: Payer: BC Managed Care – PPO | Admitting: Podiatry

## 2022-05-02 DIAGNOSIS — M722 Plantar fascial fibromatosis: Secondary | ICD-10-CM

## 2022-05-02 MED ORDER — TRIAMCINOLONE ACETONIDE 10 MG/ML IJ SUSP
10.0000 mg | Freq: Once | INTRAMUSCULAR | Status: AC
  Administered 2022-05-02: 10 mg

## 2022-05-02 NOTE — Progress Notes (Signed)
Subjective:   Patient ID: Margaret Farrell, female   DOB: 42 y.o.   MRN: [id number]   HPI Patient presents stating I had improvement but I am still having pain and I did get Hoka shoes and started to wear my orthotics in the last couple weeks   ROS      Objective:  Physical Exam  Neurovascular status intact with significant diminishment of discomfort at the plantar fascial insertion bilateral.  Patient is showing progress but she did have a over 2-year history of this so it may take time with pain now more in the central lateral aspect of the plantar fascial insertion right     Assessment:  Optimism that we are getting over the hump for her but due to the longstanding nature of this problem and still having persistent discomfort not fully confident yet     Plan:  H&P reviewed and went ahead today did sterile prep and injected the lateral and central fascial band from the lateral side 3 mg Kenalog 5 mg Xylocaine and advised this patient on continued activity stretching exercises and continued orthotic Hoka usage.  Reappoint 6 weeks to reevaluate or earlier if needed

## 2022-06-13 ENCOUNTER — Ambulatory Visit (INDEPENDENT_AMBULATORY_CARE_PROVIDER_SITE_OTHER): Payer: BC Managed Care – PPO | Admitting: Podiatry

## 2022-06-13 ENCOUNTER — Encounter: Payer: Self-pay | Admitting: Podiatry

## 2022-06-13 DIAGNOSIS — M722 Plantar fascial fibromatosis: Secondary | ICD-10-CM | POA: Diagnosis not present

## 2022-06-13 DIAGNOSIS — G589 Mononeuropathy, unspecified: Secondary | ICD-10-CM

## 2022-06-13 NOTE — Progress Notes (Signed)
Subjective:   Patient ID: Margaret Farrell, female   DOB: 42 y.o.   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 and has numerous questions   ROS      Objective:  Physical Exam  Neuro vas status intact mild pain plantar aspect right heel with pain that is more in the lateral aspect of the foot and also may be nerve and its orientation with movement of the fifth toe seeming to cause relief of discomfort     Assessment:  Possibility for nerve entrapment with also plantar fascial inflammation still present to a mild degree     Plan:  H&P discussed she has had this problem for around a year or so I think gradual improvement is occurring and I have recommended that she continue on the same course with her inserts and support and we will see her back again in 2 months and may require nerve conduction studies or other treatment depending and if symptoms get worse I want to see her back earlier

## 2022-11-05 ENCOUNTER — Encounter: Payer: Self-pay | Admitting: Obstetrics and Gynecology

## 2022-11-05 ENCOUNTER — Other Ambulatory Visit (HOSPITAL_COMMUNITY)
Admission: RE | Admit: 2022-11-05 | Discharge: 2022-11-05 | Disposition: A | Discharge status: Home / Self Care | Payer: BC Managed Care – PPO | Source: Ambulatory Visit | Attending: Obstetrics and Gynecology | Admitting: Obstetrics and Gynecology

## 2022-11-05 ENCOUNTER — Ambulatory Visit (INDEPENDENT_AMBULATORY_CARE_PROVIDER_SITE_OTHER): Payer: BC Managed Care – PPO | Admitting: Obstetrics and Gynecology

## 2022-11-05 VITALS — BP 118/80 | HR 90 | Ht 63.75 in | Wt 201.0 lb

## 2022-11-05 DIAGNOSIS — Z01419 Encounter for gynecological examination (general) (routine) without abnormal findings: Secondary | ICD-10-CM

## 2022-11-05 DIAGNOSIS — Z1231 Encounter for screening mammogram for malignant neoplasm of breast: Secondary | ICD-10-CM

## 2022-11-05 DIAGNOSIS — D3500 Benign neoplasm of unspecified adrenal gland: Secondary | ICD-10-CM

## 2022-11-05 DIAGNOSIS — D219 Benign neoplasm of connective and other soft tissue, unspecified: Secondary | ICD-10-CM | POA: Diagnosis not present

## 2022-11-05 MED ORDER — NORETHIN ACE-ETH ESTRAD-FE 1-20 MG-MCG PO TABS: 1.0000 | ORAL_TABLET | Freq: Every day | ORAL | 12 refills | Status: DC

## 2022-11-05 NOTE — Progress Notes (Signed)
42 y.o. y.o. female here for annual exam.  She has known fibroids on CT scan in the past.  Doesn't report heavy periods on her current ocp's.  Denies any pelvic pain.  Occasional with bending can feel a sharp pain in her lower pelvis that resolves shortly after.  H/o Dx lap for cystectomy found endometriosis in [place].  She is on suppression ocp's. Sister with ovarian cancer  Blood pressure 118/80, pulse 90, height 5' 3.75" (1.619 m), weight 201 lb (91.2 kg), SpO2 97%.  No results found for: "DIAGPAP", "HPVHIGH", "ADEQPAP"  GYN HISTORY: No results found for: "DIAGPAP", "HPVHIGH", "ADEQPAP"  OB History  Gravida Para Term Preterm AB Living  0 0 0 0 0 0  SAB IAB Ectopic Multiple Live Births  0 0 0 0 0    Past Medical History:  Diagnosis Date   Blood transfusion without reported diagnosis    Endometriosis     Past Surgical History:  Procedure Laterality Date   CHOLECYSTECTOMY N/A 03/20/2022   Procedure: LAPAROSCOPIC CHOLECYSTECTOMY;  Surgeon: Berna Bue, MD;  Location: [facility];  Service: General;  Laterality: N/A;   OVARIAN CYST REMOVAL Right     Current Outpatient Medications on File Prior to Visit  Medication Sig Dispense Refill   B Complex Vitamins (B COMPLEX PO) Take by mouth.     cholecalciferol (VITAMIN D3) 25 MCG (1000 UNIT) tablet 1 tablet Orally Once a day     MAGNESIUM PO Take by mouth.     Multiple Vitamin (MULTI VITAMIN) TABS 1 tablet Orally Once a day     Norethindrone-Ethinyl Estradiol-Fe Biphas (LO LOESTRIN FE) 1 MG-10 MCG / 10 MCG tablet Take 1 tablet by mouth daily.     saccharomyces boulardii (FLORASTOR) 250 MG capsule as directed Orally     UNABLE TO FIND Med Name: allertac     VITAMIN K PO Take by mouth.     Zinc 30 MG TABS 1 tablet Orally Once a day     No current facility-administered medications on file prior to visit.    Social History   Socioeconomic History   Marital status: Single    Spouse name: Not on file   Number of children:  Not on file   Years of education: Not on file   Highest education level: Not on file  Occupational History   Not on file  Tobacco Use   Smoking status: Never   Smokeless tobacco: Never  Vaping Use   Vaping status: Never Used  Substance and Sexual Activity   Alcohol use: Yes    Comment: occas   Drug use: Never   Sexual activity: Not Currently    Birth control/protection: OCP    Comment: no preference, never sexually active  Other Topics Concern   Not on file  Social History Narrative   Not on file   Social Determinants of Health   Financial Resource Strain: Not on file  Food Insecurity: Not on file  Transportation Needs: Not on file  Physical Activity: Not on file  Stress: Not on file  Social Connections: Not on file  Intimate Partner Violence: Not on file    Family History  Problem Relation Age of Onset   Diabetes Father    Hypertension Father    Ovarian cancer Sister    Diabetes Paternal Uncle    Thyroid cancer Paternal Uncle    Colon cancer Maternal Grandmother    Skin cancer Maternal Grandfather    Diabetes Paternal Grandmother  Hypertension Paternal Grandmother      Allergies  Allergen Reactions   Sulfa Antibiotics Hives, Itching and Rash       Review of Systems Alls systems reviewed and are negative.     PE General appearance: alert, cooperative and appears stated age Head: Normocephalic, without obvious abnormality, atraumatic Neck: no adenopathy, supple, symmetrical, trachea midline and thyroid normal to inspection and palpation Lungs: clear to auscultation bilaterally Breasts: normal appearance, no masses or tenderness Heart: regular rate and rhythm Abdomen: soft, non-tender; bowel sounds normal; no masses,  no organomegaly Extremities: extremities normal, atraumatic, no cyanosis or edema Skin: Skin color, texture, turgor normal. No rashes or lesions Lymph nodes: Cervical, supraclavicular, and axillary nodes normal. No abnormal inguinal  nodes palpated Neurologic: Grossly normal     Pelvic: External genitalia:  no lesions              Urethra:  normal appearing urethra with no masses, tenderness or lesions              Bartholins and Skenes: normal                 Vagina: normal appearing vagina with normal color and discharge, no lesions.               Cervix: no lesions, no cervical motion tenderness               Bimanual Exam:  Uterus:  21 week size, irregular contour, position              Adnexa:possible fullness on the right          Chaperone was present for exam.   A:         Well Woman GYN exam, fibroids, endometriosis                             P:        Pap smear collected             Encouraged annual mammogram screening             Referral for TV US placed to evaluate ovaries and fibroids.             Labs and immunizations with her primary             Discussed breast self exams             Encouraged safe sexual practices and enouraged healthy lifestyle practices with diet and exercise Refill on ocp's sent Ct scan with adrenal adenoma: referral to endocrinology placed for management.    Earley Favor

## 2022-11-05 NOTE — Patient Instructions (Addendum)
The vaginal ultrasound order has been placed, all you need to do is call the scheduling desk at [phone] to make the appointment at one of our outpatient imaging centers at your earliest convenience. This is to evaluate your uterus and ovaries.  The report will go directly to epic after.   Endocrinology referral has been placed for the adrenal mass seen on the CT scan  Mammogram referral has been placed as well.  They should call you to schedule.  Refill on loestrin sent Dr. Karma Greaser  It was so nice meeting you today

## 2022-11-06 ENCOUNTER — Encounter: Payer: Self-pay | Admitting: Obstetrics and Gynecology

## 2022-11-06 LAB — SURESWAB® ADVANCED VAGINITIS PLUS,TMA
C. trachomatis RNA, TMA: NOT DETECTED
CANDIDA SPECIES: NOT DETECTED
Candida glabrata: NOT DETECTED
N. gonorrhoeae RNA, TMA: NOT DETECTED
SURESWAB(R) ADV BACTERIAL VAGINOSIS(BV),TMA: NEGATIVE
TRICHOMONAS VAGINALIS (TV),TMA: NOT DETECTED

## 2022-11-07 NOTE — Telephone Encounter (Signed)
Pt also LVM in triage line on 11/05/2022.  We can let the pt know that the formulation is the same, [facility] may not have the brand that she usually takes and we usually dont prescribe brand only unless pt's request.   However, please advise if pt is taking or skipping placebos at this time?  Thanks.

## 2022-11-08 ENCOUNTER — Encounter: Payer: Self-pay | Admitting: Obstetrics and Gynecology

## 2022-11-08 DIAGNOSIS — D219 Benign neoplasm of connective and other soft tissue, unspecified: Secondary | ICD-10-CM

## 2022-11-09 LAB — CYTOLOGY - PAP: Diagnosis: NEGATIVE

## 2022-11-09 NOTE — Telephone Encounter (Signed)
Please also refer to mychart msg dated 11/06/2022. Thanks.

## 2022-11-12 ENCOUNTER — Ambulatory Visit (HOSPITAL_COMMUNITY)
Admission: RE | Admit: 2022-11-12 | Discharge: 2022-11-12 | Disposition: A | Discharge status: Home / Self Care | Payer: BC Managed Care – PPO | Source: Ambulatory Visit | Attending: Obstetrics and Gynecology | Admitting: Obstetrics and Gynecology

## 2022-11-12 DIAGNOSIS — D219 Benign neoplasm of connective and other soft tissue, unspecified: Secondary | ICD-10-CM | POA: Insufficient documentation

## 2022-11-12 MED ORDER — LO LOESTRIN FE 1 MG-10 MCG / 10 MCG PO TABS: 1.0000 | ORAL_TABLET | Freq: Every day | ORAL | 3 refills | Status: DC

## 2022-11-12 NOTE — Telephone Encounter (Signed)
Routed to provider for final review and closing encounter.

## 2022-11-12 NOTE — Telephone Encounter (Signed)
Earlie Lou (pharmacist @ [facility]) and confirmed that rx for loestrin was discontinued and rx for lo loestrin provided by verbal order and placed in pt's EMR. Will notify the pt of this via the other mychart encounter.   Routing encounter back to provider for final review and closing.

## 2022-11-12 NOTE — Telephone Encounter (Signed)
Per Dr. Karma Greaser:  "She is right.  She needs to be on Lo loestrin. Can you call the pharmacy. Dr. Karma Greaser "

## 2022-11-14 ENCOUNTER — Ambulatory Visit
Admission: RE | Admit: 2022-11-14 | Discharge: 2022-11-14 | Disposition: A | Discharge status: Home / Self Care | Payer: BC Managed Care – PPO | Source: Ambulatory Visit | Attending: Obstetrics and Gynecology | Admitting: Obstetrics and Gynecology

## 2022-11-14 DIAGNOSIS — Z1231 Encounter for screening mammogram for malignant neoplasm of breast: Secondary | ICD-10-CM

## 2022-11-14 DIAGNOSIS — Z01419 Encounter for gynecological examination (general) (routine) without abnormal findings: Secondary | ICD-10-CM

## 2022-11-16 ENCOUNTER — Other Ambulatory Visit: Payer: Self-pay | Admitting: Obstetrics and Gynecology

## 2022-11-16 DIAGNOSIS — R928 Other abnormal and inconclusive findings on diagnostic imaging of breast: Secondary | ICD-10-CM

## 2022-11-22 ENCOUNTER — Encounter: Payer: Self-pay | Admitting: Obstetrics and Gynecology

## 2022-11-22 MED ORDER — DROSPIRENONE-ETHINYL ESTRADIOL 3-0.03 MG PO TABS: 1.0000 | ORAL_TABLET | Freq: Every day | ORAL | 11 refills | Status: DC

## 2022-12-03 ENCOUNTER — Encounter: Payer: Self-pay | Admitting: Obstetrics and Gynecology

## 2022-12-03 ENCOUNTER — Ambulatory Visit
Admission: RE | Admit: 2022-12-03 | Discharge: 2022-12-03 | Disposition: A | Discharge status: Home / Self Care | Payer: BC Managed Care – PPO | Source: Ambulatory Visit | Attending: Obstetrics and Gynecology

## 2022-12-03 ENCOUNTER — Ambulatory Visit
Admission: RE | Admit: 2022-12-03 | Discharge: 2022-12-03 | Disposition: A | Discharge status: Home / Self Care | Payer: BC Managed Care – PPO | Source: Ambulatory Visit | Attending: Obstetrics and Gynecology | Admitting: Obstetrics and Gynecology

## 2022-12-03 ENCOUNTER — Other Ambulatory Visit: Payer: Self-pay | Admitting: Obstetrics and Gynecology

## 2022-12-03 DIAGNOSIS — D219 Benign neoplasm of connective and other soft tissue, unspecified: Secondary | ICD-10-CM | POA: Insufficient documentation

## 2022-12-03 DIAGNOSIS — R928 Other abnormal and inconclusive findings on diagnostic imaging of breast: Secondary | ICD-10-CM

## 2022-12-03 DIAGNOSIS — N6310 Unspecified lump in the right breast, unspecified quadrant: Secondary | ICD-10-CM

## 2022-12-04 ENCOUNTER — Ambulatory Visit: Payer: BC Managed Care – PPO | Admitting: Obstetrics and Gynecology

## 2022-12-04 ENCOUNTER — Encounter: Payer: Self-pay | Admitting: Obstetrics and Gynecology

## 2022-12-04 VITALS — BP 104/64 | HR 102

## 2022-12-04 DIAGNOSIS — D219 Benign neoplasm of connective and other soft tissue, unspecified: Secondary | ICD-10-CM | POA: Diagnosis not present

## 2022-12-04 DIAGNOSIS — N921 Excessive and frequent menstruation with irregular cycle: Secondary | ICD-10-CM

## 2022-12-04 DIAGNOSIS — N63 Unspecified lump in unspecified breast: Secondary | ICD-10-CM | POA: Diagnosis not present

## 2022-12-04 DIAGNOSIS — N946 Dysmenorrhea, unspecified: Secondary | ICD-10-CM

## 2022-12-04 DIAGNOSIS — N809 Endometriosis, unspecified: Secondary | ICD-10-CM | POA: Diagnosis not present

## 2022-12-04 DIAGNOSIS — N3941 Urge incontinence: Secondary | ICD-10-CM

## 2022-12-04 NOTE — Progress Notes (Unsigned)
42 y.o. y.o. female here for [place] results.  She has known fibroids on CT scan in the past.   Patient reports painful and heavy periods in the past with bleeding for 2 weeks and large clots. She does still have pelvic cramping that is bothersome. [place] with large fibroids She is on ocp's and has been having irregular periods. She ilicits urinary symptoms as well. She  She does not desire anymore children and would like to discuss options with the [facility].  She also does not want to be on ocp's for longer than she has to be.  H/o Dx lap for cystectomy found endometriosis in [place] in 2013. Report reviewed.  She is on suppression ocp's. Sister with ovarian cancer  Blood pressure 104/64, pulse (!) 102, SpO2 98%.     Component Value Date/Time   DIAGPAP  11/05/2022 0856    - Negative for intraepithelial lesion or malignancy (NILM)   ADEQPAP Satisfactory for evaluation. 11/05/2022 0856    GYN HISTORY:    Component Value Date/Time   DIAGPAP  11/05/2022 0856    - Negative for intraepithelial lesion or malignancy (NILM)   ADEQPAP Satisfactory for evaluation. 11/05/2022 0856    OB History  Gravida Para Term Preterm AB Living  0 0 0 0 0 0  SAB IAB Ectopic Multiple Live Births  0 0 0 0 0    Past Medical History:  Diagnosis Date   Blood transfusion without reported diagnosis    Endometriosis    Endometriosis    Family history of ovarian cancer    sister   Fibroadenoma    right breat.  Repeat US q 6 months   Fibroids    Menorrhagia     Past Surgical History:  Procedure Laterality Date   CHOLECYSTECTOMY N/A 03/20/2022   Procedure: LAPAROSCOPIC CHOLECYSTECTOMY;  Surgeon: Berna Bue, MD;  Location: [facility];  Service: General;  Laterality: N/A;   LAPAROSCOPIC OVARIAN CYSTECTOMY     in [place]. found endometriosis   OVARIAN CYST REMOVAL Right     Current Outpatient Medications on File Prior to Visit  Medication Sig Dispense Refill   Norethindrone-Ethinyl Estradiol-Fe Biphas (LO  LOESTRIN FE) 1 MG-10 MCG / 10 MCG tablet Take 1 tablet by mouth daily. Skipping Placebos. 112 tablet 3   UNABLE TO FIND Med Name: allertac     drospirenone-ethinyl estradiol (YASMIN 28) 3-0.03 MG tablet Take 1 tablet by mouth daily. (Patient not taking: Reported on 12/04/2022) 28 tablet 11   No current facility-administered medications on file prior to visit.    Social History   Socioeconomic History   Marital status: Single    Spouse name: Not on file   Number of children: Not on file   Years of education: Not on file   Highest education level: Not on file  Occupational History   Not on file  Tobacco Use   Smoking status: Never   Smokeless tobacco: Never  Vaping Use   Vaping status: Never Used  Substance and Sexual Activity   Alcohol use: Yes    Comment: occas   Drug use: Never   Sexual activity: Not Currently    Birth control/protection: OCP    Comment: no preference, never sexually active  Other Topics Concern   Not on file  Social History Narrative   Not on file   Social Determinants of Health   Financial Resource Strain: Not on file  Food Insecurity: Not on file  Transportation Needs: Not on file  Physical Activity: Not on file  Stress: Not on file  Social Connections: Not on file  Intimate Partner Violence: Not on file    Family History  Problem Relation Age of Onset   Diabetes Father    Hypertension Father    Ovarian cancer Sister    Diabetes Paternal Uncle    Thyroid cancer Paternal Uncle    Colon cancer Maternal Grandmother    Skin cancer Maternal Grandfather    Diabetes Paternal Grandmother    Hypertension Paternal Grandmother      Allergies  Allergen Reactions   Sulfa Antibiotics Hives, Itching and Rash       Review of Systems Alls systems reviewed and are negative.     PE General appearance: alert, cooperative and appears stated age Head: Normocephalic, without obvious abnormality, atraumatic Neck: no adenopathy, supple,  symmetrical, trachea midline and thyroid normal to inspection and palpation Lungs: clear to auscultation bilaterally Breasts: normal appearance, no masses or tenderness Heart: regular rate and rhythm Abdomen: soft, non-tender; bowel sounds normal; no masses,  no organomegaly Extremities: extremities normal, atraumatic, no cyanosis or edema Skin: Skin color, texture, turgor normal. No rashes or lesions Lymph nodes: Cervical, supraclavicular, and axillary nodes normal. No abnormal inguinal nodes palpated Neurologic: Grossly normal     Pelvic: External genitalia:  no lesions              Urethra:  normal appearing urethra with no masses, tenderness or lesions              Bartholins and Skenes: normal                 Vagina: normal appearing vagina with normal color and discharge, no lesions.               Cervix: no lesions, no cervical motion tenderness               Bimanual Exam:  Uterus:  21 week size, irregular contour, position              Adnexa:possible fullness on the right          Chaperone was present for exam. S PELVIC COMPLETE WITH TRANSVAGINAL (Accession [id number]) (Order [id number]) Imaging Date: 11/12/2022 Department: [facility] Released By: Chester Holstein Authorizing: Earley Favor, MD   Exam Status  Status  Final [99]   PACS Intelerad Image Link   Show images for US PELVIC COMPLETE WITH TRANSVAGINAL Study Result  Narrative & Impression  CLINICAL DATA:  evaluate fibroids   EXAM: ULTRASOUND OF PELVIS   TECHNIQUE: Transabdominal and transvaginalultrasound examination of the pelvis was performed including evaluation of the uterus, ovaries, adnexal regions, and pelvic cul-de-sac.   COMPARISON:  None Available.   FINDINGS: Uterusanteverted, 11 x 8 x 4 cm. Endometrium not visualized due to fibroids.   The following uterine fibroids were identified.   1. Pedunculated fundal subserosal, 11 x 9 x 6 cm. 2. Right subserosal, 3.4 x 3.6 x  2.9 cm. 3. Posterior subserosal, 4.1 x 3.6 x 3.2 cm.   Right ovary   Not visualized.   Left ovary   3.8 x 2.5 x 2.4 cm.   Images of the adnexae demonstrated no masses or fluid collections.   IMPRESSION: Uterine fibroids, described above.     Electronically Signed   By: Layla Maw M.D.   On: 11/30/2022 20:36      Result History  US PELVIC COMPLETE WITH TRANSVAGINAL (Order #[id number]) on 11/30/2022 -  Order Result History Report    A:         Well Woman GYN exam, fibroids, endometriosis                             P:         Symptomatic fibroid uterus:  Counseled on all options.  She would like to have the [facility].  Counseled extensively on the procedure including but not limited to what to expect and risks and benefits.  Counseled on postop care and pelvic rest for 10 weeks after the surgery with restricted lifting for 6 weeks after.  Counseled on the benefits of the robotic procedure with faster return to daily activities, improved outcomes, and less risk for complications. She would like to have this scheduled.  Ashby Dawes Missi Mcmackin  30 minutes spent on reviewing records, imaging,  and one on one patient time and counseling patient and documentation Dr. Karma Greaser

## 2022-12-06 ENCOUNTER — Encounter: Payer: Self-pay | Admitting: Obstetrics and Gynecology

## 2022-12-15 ENCOUNTER — Encounter: Payer: Self-pay | Admitting: Obstetrics and Gynecology

## 2022-12-17 NOTE — Telephone Encounter (Signed)
Margaret Farrell -can you confirm if records were received?

## 2023-01-01 ENCOUNTER — Encounter: Payer: Self-pay | Admitting: Obstetrics and Gynecology

## 2023-01-02 ENCOUNTER — Other Ambulatory Visit: Payer: Self-pay

## 2023-01-02 DIAGNOSIS — D3500 Benign neoplasm of unspecified adrenal gland: Secondary | ICD-10-CM

## 2023-01-04 ENCOUNTER — Other Ambulatory Visit: Payer: BC Managed Care – PPO

## 2023-01-07 ENCOUNTER — Other Ambulatory Visit: Payer: BC Managed Care – PPO

## 2023-01-09 ENCOUNTER — Ambulatory Visit: Payer: BC Managed Care – PPO | Admitting: "Endocrinology

## 2023-01-11 ENCOUNTER — Other Ambulatory Visit (INDEPENDENT_AMBULATORY_CARE_PROVIDER_SITE_OTHER): Payer: BC Managed Care – PPO

## 2023-01-11 DIAGNOSIS — D3500 Benign neoplasm of unspecified adrenal gland: Secondary | ICD-10-CM

## 2023-01-11 LAB — CORTISOL: Cortisol, Plasma: 16.8 ug/dL

## 2023-01-15 ENCOUNTER — Encounter: Payer: Self-pay | Admitting: "Endocrinology

## 2023-01-15 ENCOUNTER — Ambulatory Visit (INDEPENDENT_AMBULATORY_CARE_PROVIDER_SITE_OTHER): Payer: BC Managed Care – PPO | Admitting: "Endocrinology

## 2023-01-15 VITALS — BP 120/84 | HR 92 | Ht 63.0 in | Wt 207.8 lb

## 2023-01-15 DIAGNOSIS — D3502 Benign neoplasm of left adrenal gland: Secondary | ICD-10-CM

## 2023-01-15 NOTE — Progress Notes (Signed)
Outpatient Endocrinology Note Altamese Malcolm, MD    Josabeth Tannery January 28, 1981 [id number]  Referring Provider: Earley Favor, MD Primary Care Provider: Alysia Penna, MD Reason for consultation: Subjective   Assessment & Plan  Diagnoses and all orders for this visit:  Adenoma of left adrenal gland -     Cancel: Basic Metabolic Panel (BMET) -     Basic Metabolic Panel (BMET)  Incidental left adrenal adenoma found on CT abdomen pelvis with contrast done in 01/2022 in the ER 01/2022 CT scan Abs/Pel with contrast reported 13 mm nodule in the left adrenal gland Patient is otherwise asymptomatic  Ordered baseline adrenal labs  Return in about 6 months (around 07/15/2023).   I have reviewed current medications, nurse's notes, allergies, vital signs, past medical and surgical history, family medical history, and social history for this encounter. Counseled patient on symptoms, examination findings, lab findings, imaging results, treatment decisions and monitoring and prognosis. The patient understood the recommendations and agrees with the treatment plan. All questions regarding treatment plan were fully answered.  Altamese Ruby, MD  01/15/23   History of Present Illness HPI  Margaret Farrell is a 42 y.o. year old female who presents for evaluation of left adrenal incidentaloma.  Patient was found to have incidental left adrenal adenoma in CT scan Abs/Pel with contrast done in 01/2022 (Adrenals/Urinary Tract: 13 mm nodule in the left adrenal gland).  She weight change No moon face No fat pads Yes +dorsocervical  increased girth Yes plethora No hyperpigmentation No purple striae No proximal muscle weakness No acne No vellus/terminal hirsutism No scalp hair loss No a history of HTN a history of hypokalemia No  paroxysmal episodes of anxiety No tremors No lightheadedness No headache No palpitation No sweating Yes blurry vision No  Physical Exam  BP 120/84    Pulse 92   Ht 5\' 3"  (1.6 m)   Wt 207 lb 12.8 oz (94.3 kg)   SpO2 98%   BMI 36.81 kg/m    Constitutional: well developed, well nourished Head: normocephalic, atraumatic Eyes: sclera anicteric, no redness Neck: supple Lungs: normal respiratory effort Neurology: alert and oriented Skin: dry, no appreciable rashes Musculoskeletal: no appreciable defects Psychiatric: normal mood and affect   Current Medications Patient's Medications  New Prescriptions   No medications on file  Previous Medications   DROSPIRENONE-ETHINYL ESTRADIOL (YASMIN 28) 3-0.03 MG TABLET    Take 1 tablet by mouth daily.   NORETHINDRONE-ETHINYL ESTRADIOL-FE BIPHAS (LO LOESTRIN FE) 1 MG-10 MCG / 10 MCG TABLET    Take 1 tablet by mouth daily. Skipping Placebos.   UNABLE TO FIND    Med Name: allertac  Modified Medications   No medications on file  Discontinued Medications   No medications on file    Allergies Allergies  Allergen Reactions   Sulfa Antibiotics Hives, Itching and Rash    Past Medical History Past Medical History:  Diagnosis Date   Blood transfusion without reported diagnosis    Endometriosis    Endometriosis    Family history of ovarian cancer    sister   Fibroadenoma    right breat.  Repeat US q 6 months   Fibroids    Menorrhagia     Past Surgical History Past Surgical History:  Procedure Laterality Date   CHOLECYSTECTOMY N/A 03/20/2022   Procedure: LAPAROSCOPIC CHOLECYSTECTOMY;  Surgeon: Berna Bue, MD;  Location: [facility];  Service: General;  Laterality: N/A;   LAPAROSCOPIC OVARIAN CYSTECTOMY     in [place].  found endometriosis   OVARIAN CYST REMOVAL Right     Family History family history includes Colon cancer in her maternal grandmother; Diabetes in her father, paternal grandmother, and paternal uncle; Hypertension in her father and paternal grandmother; Ovarian cancer in her sister; Skin cancer in her maternal grandfather; Thyroid cancer in her paternal uncle.  Social  History Social History   Socioeconomic History   Marital status: Single    Spouse name: Not on file   Number of children: Not on file   Years of education: Not on file   Highest education level: Not on file  Occupational History   Not on file  Tobacco Use   Smoking status: Never   Smokeless tobacco: Never  Vaping Use   Vaping status: Never Used  Substance and Sexual Activity   Alcohol use: Yes    Comment: occas   Drug use: Never   Sexual activity: Not Currently    Birth control/protection: OCP    Comment: no preference, never sexually active  Other Topics Concern   Not on file  Social History Narrative   Not on file   Social Determinants of Health   Financial Resource Strain: Not on file  Food Insecurity: Not on file  Transportation Needs: Not on file  Physical Activity: Not on file  Stress: Not on file  Social Connections: Not on file  Intimate Partner Violence: Not on file    No results found for: "CHOL" No results found for: "HDL" No results found for: "LDLCALC" No results found for: "TRIG" No results found for: "CHOLHDL" Lab Results  Component Value Date   CREATININE 0.70 03/20/2022   No results found for: "GFR"    Component Value Date/Time   NA 137 03/20/2022 0956   K 5.4 (H) 03/20/2022 0956   CL 110 03/20/2022 0956   CO2 24 02/01/2022 0340   GLUCOSE 96 03/20/2022 0956   BUN 14 03/20/2022 0956   CREATININE 0.70 03/20/2022 0956   CALCIUM 9.1 02/01/2022 0340   PROT 7.6 02/01/2022 0340   ALBUMIN 3.9 02/01/2022 0340   AST 81 (H) 02/01/2022 0340   ALT 53 (H) 02/01/2022 0340   ALKPHOS 55 02/01/2022 0340   BILITOT 0.8 02/01/2022 0340   GFRNONAA >60 02/01/2022 0340      Latest Ref Rng & Units 03/20/2022    9:56 AM 02/01/2022    3:50 AM 02/01/2022    3:40 AM  BMP  Glucose 70 - 99 mg/dL 96  409  811   BUN 6 - 20 mg/dL 14  15  12    Creatinine 0.44 - 1.00 mg/dL 9.14  7.82  9.56   Sodium 135 - 145 mmol/L 137  137  140   Potassium 3.5 - 5.1 mmol/L  5.4  6.3  3.9   Chloride 98 - 111 mmol/L 110  106  108   CO2 22 - 32 mmol/L   24   Calcium 8.9 - 10.3 mg/dL   9.1        Component Value Date/Time   WBC 4.1 03/15/2022 0825   RBC 4.97 03/15/2022 0825   HGB 16.7 (H) 03/20/2022 0956   HCT 49.0 (H) 03/20/2022 0956   PLT 255 03/15/2022 0825   MCV 88.1 03/15/2022 0825   MCH 29.0 03/15/2022 0825   MCHC 32.9 03/15/2022 0825   RDW 12.9 03/15/2022 0825   LYMPHSABS 1.9 02/01/2022 0340   MONOABS 0.8 02/01/2022 0340   EOSABS 0.1 02/01/2022 0340   BASOSABS 0.1 02/01/2022 0340  No results found for: "TSH", "FREET4"       Parts of this note may have been dictated using voice recognition software. There may be variances in spelling and vocabulary which are unintentional. Not all errors are proofread. Please notify the [facility] if any discrepancies are noted or if the meaning of any statement is not clear.

## 2023-01-16 LAB — BASIC METABOLIC PANEL
BUN: 10 mg/dL (ref 7–25)
CO2: 25 mmol/L (ref 20–32)
Calcium: 8.8 mg/dL (ref 8.6–10.2)
Chloride: 106 mmol/L (ref 98–110)
Creat: 0.76 mg/dL (ref 0.50–0.99)
Glucose, Bld: 132 mg/dL (ref 65–139)
Potassium: 4 mmol/L (ref 3.5–5.3)
Sodium: 139 mmol/L (ref 135–146)

## 2023-01-16 NOTE — Telephone Encounter (Addendum)
See surgery referral, planning for surgery 03/27/23.    Patient states she has been on Yasmin for 2 months, is requesting to change OCP. States she has been on her period for 2 weeks. Reports intermittent bleeding varying from spotting to light flow. Denies any late or missed pills. Patient is requesting alternative. Advised Dr. Karma Greaser is out of the office this week, will forward message for her to review and f/u on 12/2/224. Patient verbalizes understanding and is agreeable.   Dr. Karma Greaser -please review.

## 2023-01-19 LAB — ALDOSTERONE + RENIN ACTIVITY W/ RATIO
ALDO / PRA Ratio: 5.6 {ratio} (ref 0.9–28.9)
Renin Activity: 7.62 ng/mL/h — ABNORMAL HIGH (ref 0.25–5.82)
Renin Activity: 43 ng/mL/h — ABNORMAL HIGH (ref 0.25–5.82)

## 2023-01-19 LAB — ACTH: C206 ACTH: 6 pg/mL (ref 6–50)

## 2023-01-19 LAB — METANEPHRINES, PLASMA
Metanephrine, Free: <25 pg/mL (ref <=57)
Normetanephrine, Free: 65 pg/mL (ref <=148)
Total Metanephrines-Plasma: 65 pg/mL (ref <=205)

## 2023-01-19 LAB — DHEA-SULFATE: DHEA-SO4: 82 ug/dL (ref 15–205)

## 2023-01-21 MED ORDER — SLYND 4 MG PO TABS: ORAL_TABLET | ORAL | 0 refills | Status: DC

## 2023-01-21 NOTE — Addendum Note (Signed)
Addended by: Leda Min on: 01/21/2023 12:08 PM   Modules accepted: Orders

## 2023-01-21 NOTE — Telephone Encounter (Signed)
Spoke with patient, advised per Dr. Karma Greaser. Patient agreeable. New Rx sent to verified pharmacy. Patient verbalizes understanding and is agreeable.   Encounter closed.

## 2023-01-22 ENCOUNTER — Encounter: Payer: Self-pay | Admitting: Obstetrics and Gynecology

## 2023-01-22 MED ORDER — NORETHINDRONE 0.35 MG PO TABS: 1.0000 | ORAL_TABLET | Freq: Every day | ORAL | 11 refills | Status: DC

## 2023-01-24 ENCOUNTER — Encounter: Payer: Self-pay | Admitting: "Endocrinology

## 2023-01-24 ENCOUNTER — Ambulatory Visit: Payer: BC Managed Care – PPO | Admitting: "Endocrinology

## 2023-01-24 VITALS — BP 120/84 | HR 111 | Ht 63.0 in | Wt 209.4 lb

## 2023-01-24 DIAGNOSIS — D3502 Benign neoplasm of left adrenal gland: Secondary | ICD-10-CM

## 2023-01-24 NOTE — Progress Notes (Signed)
Outpatient Endocrinology Note Margaret [place], MD    Margaret Farrell Nov 26, 1980 [id number]  Referring Provider: Alysia Penna, MD Primary Care Provider: Alysia Penna, MD Reason for consultation: Subjective   Assessment & Plan  Diagnoses and all orders for this visit:  Adenoma of left adrenal gland -     Aldosterone + renin activity w/ ratio -     CT ABDOMEN W WO CONTRAST; Future   Incidental left adrenal adenoma found on CT abdomen pelvis with contrast done in 01/2022 in the ER 01/2022 CT scan Abs/Pel with contrast reported 13 mm nodule in the left adrenal gland Patient is otherwise asymptomatic  Ordered baseline adrenal labs: labs WNL except aldosterone elevated at 43 with elevated renin at 7.6-no intake of spironolactone/ACE/ARB Ordered repeat lab Ordered f/u CT abdomen W WO contrast (GFR WNL, potassium WNL)  Return in about 22 days (around 02/15/2023) for visit, labs today.   I have reviewed current medications, nurse's notes, allergies, vital signs, past medical and surgical history, family medical history, and social history for this encounter. Counseled patient on symptoms, examination findings, lab findings, imaging results, treatment decisions and monitoring and prognosis. The patient understood the recommendations and agrees with the treatment plan. All questions regarding treatment plan were fully answered.  Margaret Cygnet, MD  01/24/23   History of Present Illness HPI  Margaret Farrell is a 42 y.o. year old female who presents for evaluation of left adrenal incidentaloma.  Patient was found to have incidental left adrenal adenoma in CT scan Abs/Pel with contrast done in 01/2022 (Adrenals/Urinary Tract: 13 mm nodule in the left adrenal gland).  She weight change No moon face No fat pads Yes +dorsocervical  increased girth Yes plethora No hyperpigmentation No purple striae No proximal muscle weakness No acne No vellus/terminal hirsutism No scalp hair  loss No a history of HTN a history of hypokalemia No  paroxysmal episodes of anxiety No tremors No lightheadedness No headache No palpitation No sweating Yes blurry vision No  Physical Exam  BP 120/84   Pulse (!) 111   Ht 5\' 3"  (1.6 m)   Wt 209 lb 6.4 oz (95 kg)   SpO2 98%   BMI 37.09 kg/m    Constitutional: well developed, well nourished Head: normocephalic, atraumatic Eyes: sclera anicteric, no redness Neck: supple Lungs: normal respiratory effort Neurology: alert and oriented Skin: dry, no appreciable rashes Musculoskeletal: no appreciable defects Psychiatric: normal mood and affect   Current Medications Patient's Medications  New Prescriptions   No medications on file  Previous Medications   DROSPIRENONE (SLYND) 4 MG TABS    Take 2 tablets daily until bleeding stops, then once daily.   DROSPIRENONE-ETHINYL ESTRADIOL (YAZ) 3-0.02 MG TABLET    Take 1 tablet by mouth daily.   NORETHINDRONE (MICRONOR) 0.35 MG TABLET    Take 1 tablet (0.35 mg total) by mouth daily.   UNABLE TO FIND    Med Name: allertac  Modified Medications   No medications on file  Discontinued Medications   No medications on file    Allergies Allergies  Allergen Reactions   Sulfa Antibiotics Hives, Itching and Rash    Past Medical History Past Medical History:  Diagnosis Date   Blood transfusion without reported diagnosis    Endometriosis    Endometriosis    Family history of ovarian cancer    sister   Fibroadenoma    right breat.  Repeat US q 6 months   Fibroids    Menorrhagia  Past Surgical History Past Surgical History:  Procedure Laterality Date   CHOLECYSTECTOMY N/A 03/20/2022   Procedure: LAPAROSCOPIC CHOLECYSTECTOMY;  Surgeon: Berna Bue, MD;  Location: [facility];  Service: General;  Laterality: N/A;   LAPAROSCOPIC OVARIAN CYSTECTOMY     in [place]. found endometriosis   OVARIAN CYST REMOVAL Right     Family History family history includes Colon cancer in her  maternal grandmother; Diabetes in her father, paternal grandmother, and paternal uncle; Hypertension in her father and paternal grandmother; Ovarian cancer in her sister; Skin cancer in her maternal grandfather; Thyroid cancer in her paternal uncle.  Social History Social History   Socioeconomic History   Marital status: Single    Spouse name: Not on file   Number of children: Not on file   Years of education: Not on file   Highest education level: Not on file  Occupational History   Not on file  Tobacco Use   Smoking status: Never   Smokeless tobacco: Never  Vaping Use   Vaping status: Never Used  Substance and Sexual Activity   Alcohol use: Yes    Comment: occas   Drug use: Never   Sexual activity: Not Currently    Birth control/protection: OCP    Comment: no preference, never sexually active  Other Topics Concern   Not on file  Social History Narrative   Not on file   Social Determinants of Health   Financial Resource Strain: Not on file  Food Insecurity: Not on file  Transportation Needs: Not on file  Physical Activity: Not on file  Stress: Not on file  Social Connections: Not on file  Intimate Partner Violence: Not on file    No results found for: "CHOL" No results found for: "HDL" No results found for: "LDLCALC" No results found for: "TRIG" No results found for: "CHOLHDL" Lab Results  Component Value Date   CREATININE 0.76 01/15/2023   No results found for: "GFR"    Component Value Date/Time   NA 139 01/15/2023 1021   K 4.0 01/15/2023 1021   CL 106 01/15/2023 1021   CO2 25 01/15/2023 1021   GLUCOSE 132 01/15/2023 1021   BUN 10 01/15/2023 1021   CREATININE 0.76 01/15/2023 1021   CALCIUM 8.8 01/15/2023 1021   PROT 7.6 02/01/2022 0340   ALBUMIN 3.9 02/01/2022 0340   AST 81 (H) 02/01/2022 0340   ALT 53 (H) 02/01/2022 0340   ALKPHOS 55 02/01/2022 0340   BILITOT 0.8 02/01/2022 0340   GFRNONAA >60 02/01/2022 0340      Latest Ref Rng & Units  01/15/2023   10:21 AM 03/20/2022    9:56 AM 02/01/2022    3:50 AM  BMP  Glucose 65 - 139 mg/dL 644  96  034   BUN 7 - 25 mg/dL 10  14  15    Creatinine 0.50 - 0.99 mg/dL 7.42  5.95  6.38   BUN/Creat Ratio 6 - 22 (calc) SEE NOTE:     Sodium 135 - 146 mmol/L 139  137  137   Potassium 3.5 - 5.3 mmol/L 4.0  5.4  6.3   Chloride 98 - 110 mmol/L 106  110  106   CO2 20 - 32 mmol/L 25     Calcium 8.6 - 10.2 mg/dL 8.8          Component Value Date/Time   WBC 4.1 03/15/2022 0825   RBC 4.97 03/15/2022 0825   HGB 16.7 (H) 03/20/2022 0956   HCT 49.0 (H)  03/20/2022 0956   PLT 255 03/15/2022 0825   MCV 88.1 03/15/2022 0825   MCH 29.0 03/15/2022 0825   MCHC 32.9 03/15/2022 0825   RDW 12.9 03/15/2022 0825   LYMPHSABS 1.9 02/01/2022 0340   MONOABS 0.8 02/01/2022 0340   EOSABS 0.1 02/01/2022 0340   BASOSABS 0.1 02/01/2022 0340   No results found for: "TSH", "FREET4"       Parts of this note may have been dictated using voice recognition software. There may be variances in spelling and vocabulary which are unintentional. Not all errors are proofread. Please notify the [facility] if any discrepancies are noted or if the meaning of any statement is not clear.

## 2023-01-28 ENCOUNTER — Encounter: Payer: Self-pay | Admitting: "Endocrinology

## 2023-01-28 MED ORDER — NORETHINDRONE 0.35 MG PO TABS: 1.0000 | ORAL_TABLET | Freq: Every day | ORAL | 2 refills | Status: DC

## 2023-01-28 NOTE — Telephone Encounter (Signed)
Per review of EPIC, patient was seen by Endocrinology on 01/24/23 and Rx was sent for Yaz.   Routing to Dr. Karma Greaser to review.

## 2023-01-28 NOTE — Addendum Note (Signed)
Addended by: Leda Min on: 01/28/2023 10:09 AM   Modules accepted: Orders

## 2023-01-31 ENCOUNTER — Ambulatory Visit (HOSPITAL_COMMUNITY)
Admission: RE | Admit: 2023-01-31 | Discharge: 2023-01-31 | Disposition: A | Discharge status: Home / Self Care | Payer: BC Managed Care – PPO | Source: Ambulatory Visit | Attending: "Endocrinology | Admitting: "Endocrinology

## 2023-01-31 DIAGNOSIS — D3502 Benign neoplasm of left adrenal gland: Secondary | ICD-10-CM

## 2023-01-31 LAB — ALDOSTERONE + RENIN ACTIVITY W/ RATIO
ALDO / PRA Ratio: 13.8 {ratio} (ref 0.9–28.9)
Aldosterone: 41 ng/dL
Renin Activity: 2.98 ng/mL/h (ref 0.25–5.82)

## 2023-01-31 MED ORDER — IOHEXOL 350 MG/ML SOLN
75.0000 mL | Freq: Once | INTRAVENOUS | Status: AC | PRN
  Administered 2023-01-31: 75 mL via INTRAVENOUS

## 2023-02-04 MED ORDER — NORETHINDRONE 0.35 MG PO TABS: 1.0000 | ORAL_TABLET | Freq: Every day | ORAL | 2 refills | Status: DC

## 2023-02-04 NOTE — Telephone Encounter (Signed)
Call placed to [facility], Spoke with Sophia, Pharmacists.   Was advised norethindrone 0.35 mg Rx on file from 01/22/23, 30 days supply with 11 refills. Did not receive Rx on 01/28/23 updating to 90 day supply with 2 refills.   Verbal order given, read back and confirmed.   Patient updated by MyChart.   Encounter closed.

## 2023-02-04 NOTE — Addendum Note (Signed)
Addended by: Leda Min on: 02/04/2023 10:26 AM   Modules accepted: Orders

## 2023-02-15 ENCOUNTER — Ambulatory Visit (INDEPENDENT_AMBULATORY_CARE_PROVIDER_SITE_OTHER): Payer: BC Managed Care – PPO | Admitting: "Endocrinology

## 2023-02-15 ENCOUNTER — Encounter: Payer: Self-pay | Admitting: "Endocrinology

## 2023-02-15 VITALS — BP 132/88 | HR 90 | Resp 16 | Ht 63.0 in | Wt 207.8 lb

## 2023-02-15 DIAGNOSIS — D3502 Benign neoplasm of left adrenal gland: Secondary | ICD-10-CM

## 2023-02-15 DIAGNOSIS — E269 Hyperaldosteronism, unspecified: Secondary | ICD-10-CM | POA: Diagnosis not present

## 2023-02-15 MED ORDER — HYDRALAZINE HCL 10 MG PO TABS: 10.0000 mg | ORAL_TABLET | Freq: Three times a day (TID) | ORAL | 0 refills | Status: DC

## 2023-02-15 MED ORDER — SODIUM CHLORIDE 1 G PO TABS: 1.0000 g | ORAL_TABLET | Freq: Two times a day (BID) | ORAL | 0 refills | Status: AC

## 2023-02-15 MED ORDER — POTASSIUM CHLORIDE CRYS ER 20 MEQ PO TBCR: 20.0000 meq | EXTENDED_RELEASE_TABLET | Freq: Two times a day (BID) | ORAL | 0 refills | Status: DC

## 2023-02-15 NOTE — Patient Instructions (Signed)
3-day salt loading testing for adrenal.   On day1, day2 and day3, patient is supposed to take 1 gram salt tablet 2 times a day as well as salt rich diet including hamburger, pickles, french fries, canned food/soup etc.   Patient will also start taking potassium tablets, 1 tablet twice a day.   On day1, day2 and day3, patient is going to get lab for BMP done in morning.   On day3, patient will also start collecting 24 hour urine and return it to lab for testing on day4.   There is a chance that patient's blood pressure will rise given all the salt intake. Patient should have a backup hydralazine and take the hydralazine 3 times a day if blood pressure is higher than 150/68mmHg. It is sent to the patient's pharmacy.   Patient can continue other BP meds.   Patient should have a BP meter and check BP twice a day in those 3 days.   Call us if BP rises>160/100 in spite of taking hydralazine.   Thanks

## 2023-02-15 NOTE — Progress Notes (Signed)
Outpatient Endocrinology Note Margaret Beckwourth, MD    Margaret Farrell 1980/04/06 [id number]  Referring Provider: Alysia Penna, MD Primary Care Provider: Alysia Penna, MD Reason for consultation: Subjective   Assessment & Plan  Diagnoses and all orders for this visit:  Adenoma of left adrenal gland -     Basic metabolic panel -     Basic metabolic panel -     Basic metabolic panel -     Basic metabolic panel -     Cancel: Aldosterone, urine, 24 hour; Future -     Cancel: Sodium, urine, 24 hour; Future -     Aldosterone, urine, 24 hour  Hyperaldosteronism (HCC) -     Aldosterone, urine, 24 hour  Other orders -     sodium chloride 1 g tablet; Take 1 tablet (1 g total) by mouth in the morning and at bedtime for 3 days. -     hydrALAZINE (APRESOLINE) 10 MG tablet; Take 1 tablet (10 mg total) by mouth 3 (three) times daily for 10 days. Take three times a day IF BP is more than 150/90 -     potassium chloride SA (KLOR-CON M) 20 MEQ tablet; Take 1 tablet (20 mEq total) by mouth 2 (two) times daily for 3 days.    Incidental left adrenal adenoma found on CT abdomen pelvis with contrast done in 01/2022 in the ER 01/2022 CT scan Abs/Pel with contrast reported 13 mm nodule in the left adrenal gland 01/2023 CT abd W/WO reported enhancing 13 mm left adrenal nodule is consistent with a benign adrenal adenoma. No further imaging follow-up is required. Patient is otherwise asymptomatic  Ordered baseline adrenal labs: labs WNL except aldosterone elevated at 43 with elevated renin at 7.6-no intake of spironolactone/ACE/ARB Ordered repeat lab: Aldosterone came back again high at 41, with renin at 3 Ordered 3-day salt loading test to assess for primary hyperaldosteronism  Return in about 1 month (around 03/20/2023) for visit, labs today, 8am lab on Jan 6th, 8am lab on Jan 7th, 8am lab on Jan 8th.   I have reviewed current medications, nurse's notes, allergies, vital signs, past medical  and surgical history, family medical history, and social history for this encounter. Counseled patient on symptoms, examination findings, lab findings, imaging results, treatment decisions and monitoring and prognosis. The patient understood the recommendations and agrees with the treatment plan. All questions regarding treatment plan were fully answered.  Margaret [name], MD  02/15/23   History of Present Illness HPI  Margaret Farrell is a 42 y.o. year old female who presents for evaluation of left adrenal incidentaloma.  Patient was found to have incidental left adrenal adenoma in CT scan Abs/Pel with contrast done in 01/2022 (Adrenals/Urinary Tract: 13 mm nodule in the left adrenal gland).  No active complaints Patient has history of fibroids, endometriosis and plans for hysterectomy in 03/2023  Initial history  She weight change No moon face No fat pads Yes +dorsocervical  increased girth Yes plethora No hyperpigmentation No purple striae No proximal muscle weakness No acne No vellus/terminal hirsutism No scalp hair loss No a history of HTN a history of hypokalemia No  paroxysmal episodes of anxiety No tremors No lightheadedness No headache No palpitation No sweating Yes blurry vision No  Physical Exam  BP 132/88   Pulse 90   Resp 16   Ht 5\' 3"  (1.6 m)   Wt 207 lb 12.8 oz (94.3 kg)   SpO2 99%   BMI 36.81 kg/m  Constitutional: well developed, well nourished Head: normocephalic, atraumatic Eyes: sclera anicteric, no redness Neck: supple Lungs: normal respiratory effort Neurology: alert and oriented Skin: dry, no appreciable rashes Musculoskeletal: no appreciable defects Psychiatric: normal mood and affect   Current Medications Patient's Medications  New Prescriptions   HYDRALAZINE (APRESOLINE) 10 MG TABLET    Take 1 tablet (10 mg total) by mouth 3 (three) times daily for 10 days. Take three times a day IF BP is more than 150/90   POTASSIUM CHLORIDE SA  (KLOR-CON M) 20 MEQ TABLET    Take 1 tablet (20 mEq total) by mouth 2 (two) times daily for 3 days.   SODIUM CHLORIDE 1 G TABLET    Take 1 tablet (1 g total) by mouth in the morning and at bedtime for 3 days.  Previous Medications   DROSPIRENONE-ETHINYL ESTRADIOL (YAZ) 3-0.02 MG TABLET    Take 1 tablet by mouth daily.   NORETHINDRONE (MICRONOR) 0.35 MG TABLET    Take 1 tablet (0.35 mg total) by mouth daily.   UNABLE TO FIND    Med Name: allertac  Modified Medications   No medications on file  Discontinued Medications   No medications on file    Allergies Allergies  Allergen Reactions   Sulfa Antibiotics Hives, Itching and Rash    Past Medical History Past Medical History:  Diagnosis Date   Blood transfusion without reported diagnosis    Endometriosis    Endometriosis    Family history of ovarian cancer    sister   Fibroadenoma    right breat.  Repeat US q 6 months   Fibroids    Menorrhagia     Past Surgical History Past Surgical History:  Procedure Laterality Date   CHOLECYSTECTOMY N/A 03/20/2022   Procedure: LAPAROSCOPIC CHOLECYSTECTOMY;  Surgeon: Berna Bue, MD;  Location: [facility];  Service: General;  Laterality: N/A;   LAPAROSCOPIC OVARIAN CYSTECTOMY     in [place]. found endometriosis   OVARIAN CYST REMOVAL Right     Family History family history includes Colon cancer in her maternal grandmother; Diabetes in her father, paternal grandmother, and paternal uncle; Hypertension in her father and paternal grandmother; Ovarian cancer in her sister; Skin cancer in her maternal grandfather; Thyroid cancer in her paternal uncle.  Social History Social History   Socioeconomic History   Marital status: Single    Spouse name: Not on file   Number of children: Not on file   Years of education: Not on file   Highest education level: Not on file  Occupational History   Not on file  Tobacco Use   Smoking status: Never   Smokeless tobacco: Never  Vaping Use   Vaping  status: Never Used  Substance and Sexual Activity   Alcohol use: Yes    Comment: occas   Drug use: Never   Sexual activity: Not Currently    Birth control/protection: OCP    Comment: no preference, never sexually active  Other Topics Concern   Not on file  Social History Narrative   Not on file   Social Drivers of Health   Financial Resource Strain: Not on file  Food Insecurity: Not on file  Transportation Needs: Not on file  Physical Activity: Not on file  Stress: Not on file  Social Connections: Not on file  Intimate Partner Violence: Not on file    No results found for: "CHOL" No results found for: "HDL" No results found for: "LDLCALC" No results found for: "TRIG" No results  found for: "CHOLHDL" Lab Results  Component Value Date   CREATININE 0.76 01/15/2023   No results found for: "GFR"    Component Value Date/Time   NA 139 01/15/2023 1021   K 4.0 01/15/2023 1021   CL 106 01/15/2023 1021   CO2 25 01/15/2023 1021   GLUCOSE 132 01/15/2023 1021   BUN 10 01/15/2023 1021   CREATININE 0.76 01/15/2023 1021   CALCIUM 8.8 01/15/2023 1021   PROT 7.6 02/01/2022 0340   ALBUMIN 3.9 02/01/2022 0340   AST 81 (H) 02/01/2022 0340   ALT 53 (H) 02/01/2022 0340   ALKPHOS 55 02/01/2022 0340   BILITOT 0.8 02/01/2022 0340   GFRNONAA >60 02/01/2022 0340      Latest Ref Rng & Units 01/15/2023   10:21 AM 03/20/2022    9:56 AM 02/01/2022    3:50 AM  BMP  Glucose 65 - 139 mg/dL 578  96  469   BUN 7 - 25 mg/dL 10  14  15    Creatinine 0.50 - 0.99 mg/dL 6.29  5.28  4.13   BUN/Creat Ratio 6 - 22 (calc) SEE NOTE:     Sodium 135 - 146 mmol/L 139  137  137   Potassium 3.5 - 5.3 mmol/L 4.0  5.4  6.3   Chloride 98 - 110 mmol/L 106  110  106   CO2 20 - 32 mmol/L 25     Calcium 8.6 - 10.2 mg/dL 8.8          Component Value Date/Time   WBC 4.1 03/15/2022 0825   RBC 4.97 03/15/2022 0825   HGB 16.7 (H) 03/20/2022 0956   HCT 49.0 (H) 03/20/2022 0956   PLT 255 03/15/2022 0825   MCV  88.1 03/15/2022 0825   MCH 29.0 03/15/2022 0825   MCHC 32.9 03/15/2022 0825   RDW 12.9 03/15/2022 0825   LYMPHSABS 1.9 02/01/2022 0340   MONOABS 0.8 02/01/2022 0340   EOSABS 0.1 02/01/2022 0340   BASOSABS 0.1 02/01/2022 0340   No results found for: "TSH", "FREET4"       Parts of this note may have been dictated using voice recognition software. There may be variances in spelling and vocabulary which are unintentional. Not all errors are proofread. Please notify the [facility] Parkin if any discrepancies are noted or if the meaning of any statement is not clear.

## 2023-02-18 ENCOUNTER — Other Ambulatory Visit: Payer: BC Managed Care – PPO

## 2023-02-19 LAB — BASIC METABOLIC PANEL WITH GFR
BUN: 10 mg/dL (ref 7–25)
CO2: 23 mmol/L (ref 20–32)
Calcium: 8.8 mg/dL (ref 8.6–10.2)
Chloride: 105 mmol/L (ref 98–110)
Creat: 0.76 mg/dL (ref 0.50–0.99)
Glucose, Bld: 90 mg/dL (ref 65–139)
Potassium: 4.3 mmol/L (ref 3.5–5.3)
Sodium: 139 mmol/L (ref 135–146)
eGFR: 101 mL/min/{1.73_m2} (ref >=60)

## 2023-02-25 ENCOUNTER — Other Ambulatory Visit: Payer: BC Managed Care – PPO

## 2023-02-26 ENCOUNTER — Other Ambulatory Visit: Payer: BC Managed Care – PPO

## 2023-02-27 ENCOUNTER — Other Ambulatory Visit: Payer: BC Managed Care – PPO

## 2023-02-27 ENCOUNTER — Encounter: Payer: BC Managed Care – PPO | Admitting: Obstetrics and Gynecology

## 2023-03-06 ENCOUNTER — Ambulatory Visit (INDEPENDENT_AMBULATORY_CARE_PROVIDER_SITE_OTHER): Payer: BC Managed Care – PPO | Admitting: Obstetrics and Gynecology

## 2023-03-06 ENCOUNTER — Encounter: Payer: Self-pay | Admitting: Obstetrics and Gynecology

## 2023-03-06 ENCOUNTER — Other Ambulatory Visit (HOSPITAL_COMMUNITY)
Admission: RE | Admit: 2023-03-06 | Discharge: 2023-03-06 | Disposition: A | Discharge status: Home / Self Care | Payer: BC Managed Care – PPO | Source: Ambulatory Visit | Attending: Obstetrics and Gynecology | Admitting: Obstetrics and Gynecology

## 2023-03-06 ENCOUNTER — Telehealth: Payer: Self-pay | Admitting: Obstetrics and Gynecology

## 2023-03-06 VITALS — BP 116/86 | HR 89 | Ht 63.75 in | Wt 205.0 lb

## 2023-03-06 DIAGNOSIS — N946 Dysmenorrhea, unspecified: Secondary | ICD-10-CM | POA: Insufficient documentation

## 2023-03-06 DIAGNOSIS — N809 Endometriosis, unspecified: Secondary | ICD-10-CM

## 2023-03-06 DIAGNOSIS — D219 Benign neoplasm of connective and other soft tissue, unspecified: Secondary | ICD-10-CM | POA: Diagnosis present

## 2023-03-06 DIAGNOSIS — Z01818 Encounter for other preprocedural examination: Secondary | ICD-10-CM | POA: Diagnosis not present

## 2023-03-06 DIAGNOSIS — N921 Excessive and frequent menstruation with irregular cycle: Secondary | ICD-10-CM

## 2023-03-06 MED ORDER — OXYCODONE HCL 5 MG PO TABS: 5.0000 mg | ORAL_TABLET | ORAL | 0 refills | Status: DC | PRN

## 2023-03-06 MED ORDER — IBUPROFEN 800 MG PO TABS: 800.0000 mg | ORAL_TABLET | Freq: Three times a day (TID) | ORAL | 1 refills | Status: DC | PRN

## 2023-03-06 MED ORDER — METOCLOPRAMIDE HCL 10 MG PO TABS: 10.0000 mg | ORAL_TABLET | Freq: Three times a day (TID) | ORAL | 0 refills | Status: DC | PRN

## 2023-03-06 NOTE — Telephone Encounter (Signed)
 Peer to peer completed with Dr. Malcolm Scrivener. Needed clarification on bleeding and documented with today's visit with Super tampon Q2 hours and sometimes can bleed for a month. Sept states not bleeding heavy, which is not accurate. Clarified bleeding and she will approve with note from today and send this to [facility].  Dr. Melidonian will call Jill and give fax number to resend today's note.  Explained EMB was completed today as well and she was pleased with that. Dr. Tia Flowers  To Send to [place] and [place]

## 2023-03-06 NOTE — Telephone Encounter (Signed)
Additional information faxed as requested

## 2023-03-06 NOTE — Progress Notes (Addendum)
 PREOP H&P, EMB 43 y.o. y.o. female here for US  results.  She has known fibroids on CT scan in the past.   Patient reports painful and heavy periods in the past with bleeding for 2 weeks and large clots. She has even bled for a month on ocp's. She does still have pelvic cramping that is bothersome to her as well. US  with large fibroids She is on ocp's since after her surgery in 2013, and has been having irregular periods and pain since. She tried another ocp recently and is still bleeding uncontrollably.  She reports using a super tampon Q 2 hours and can bleed through these.  Has clots as well.  She ilicits urinary symptoms as well. She  She does not desire anymore children and would like to discuss options with the [facility].  She also does not want to be on ocp's for longer than she has to be, and they are not improving her cycle.  H/o Dx lap for cystectomy found endometriosis in [place] in 2013. Report reviewed.  She is on suppression ocp's. Sister with ovarian cancer She limits her activities and stays home because of the bleeding and pain.  She Reports she can bleed through her clothes, and this is embarrassing if she is out in public.  Her periods are affecting the quality of her life and she would like a definitive management.  She would like to move the surgery date up if possible as well.  Sept note not accurate on bleeding and clarified above. Blood pressure 116/86, pulse 89, height 5' 3.75" (1.619 m), weight 205 lb (93 kg), last menstrual period 02/28/2023, SpO2 99%.     Component Value Date/Time   DIAGPAP  11/05/2022 0856    - Negative for intraepithelial lesion or malignancy (NILM)   ADEQPAP Satisfactory for evaluation. 11/05/2022 0856    GYN HISTORY:    Component Value Date/Time   DIAGPAP  11/05/2022 0856    - Negative for intraepithelial lesion or malignancy (NILM)   ADEQPAP Satisfactory for evaluation. 11/05/2022 0856    OB History  Gravida Para Term Preterm AB Living  0 0 0  0 0 0  SAB IAB Ectopic Multiple Live Births  0 0 0 0 0    Past Medical History:  Diagnosis Date   Blood transfusion without reported diagnosis    Endometriosis    Endometriosis    Family history of ovarian cancer    sister   Fibroadenoma    right breat.  Repeat US  q 6 months   Fibroids    Menorrhagia     Past Surgical History:  Procedure Laterality Date   CHOLECYSTECTOMY N/A 03/20/2022   Procedure: LAPAROSCOPIC CHOLECYSTECTOMY;  Surgeon: Adalberto Acton, MD;  Location: [facility];  Service: General;  Laterality: N/A;   LAPAROSCOPIC OVARIAN CYSTECTOMY     in [place]. found endometriosis   OVARIAN CYST REMOVAL Right     Current Outpatient Medications on File Prior to Visit  Medication Sig Dispense Refill   B Complex Vitamins (B COMPLEX PO) Take by mouth.     MAGNESIUM PO Take by mouth.     Menaquinone-7 (K2 PO) Take by mouth.     Multiple Vitamin (MULTIVITAMIN PO) Take by mouth.     Multiple Vitamins-Minerals (ZINC  PO) Take by mouth.     norethindrone  (MICRONOR ) 0.35 MG tablet Take 1 tablet (0.35 mg total) by mouth daily. 84 tablet 2   Probiotic Product (PROBIOTIC PO) Take by mouth.  UNABLE TO FIND Med Name: allertac     VITAMIN D PO Take by mouth.     No current facility-administered medications on file prior to visit.    Social History   Socioeconomic History   Marital status: Single    Spouse name: Not on file   Number of children: Not on file   Years of education: Not on file   Highest education level: Not on file  Occupational History   Not on file  Tobacco Use   Smoking status: Never   Smokeless tobacco: Never  Vaping Use   Vaping status: Never Used  Substance and Sexual Activity   Alcohol use: Yes    Comment: occas   Drug use: Never   Sexual activity: Not Currently    Birth control/protection: OCP    Comment: no preference, never sexually active  Other Topics Concern   Not on file  Social History Narrative   Not on file   Social Drivers of Health    Financial Resource Strain: Not on file  Food Insecurity: Not on file  Transportation Needs: Not on file  Physical Activity: Not on file  Stress: Not on file  Social Connections: Not on file  Intimate Partner Violence: Not on file    Family History  Problem Relation Age of Onset   Diabetes Father    Hypertension Father    Ovarian cancer Sister    Diabetes Paternal Uncle    Thyroid cancer Paternal Uncle    Colon cancer Maternal Grandmother    Skin cancer Maternal Grandfather    Diabetes Paternal Grandmother    Hypertension Paternal Grandmother      Allergies  Allergen Reactions   Sulfa Antibiotics Hives, Itching and Rash       Review of Systems Alls systems reviewed and are negative.     PE General appearance: alert, cooperative and appears stated age Head: Normocephalic, without obvious abnormality, atraumatic Neck: no adenopathy, supple, symmetrical, trachea midline and thyroid normal to inspection and palpation Lungs: clear to auscultation bilaterally Breasts: normal appearance, no masses or tenderness Heart: regular rate and rhythm Abdomen: soft, non-tender; bowel sounds normal; no masses,  no organomegaly Extremities: extremities normal, atraumatic, no cyanosis or edema Skin: Skin color, texture, turgor normal. No rashes or lesions Lymph nodes: Cervical, supraclavicular, and axillary nodes normal. No abnormal inguinal nodes palpated Neurologic: Grossly normal     Pelvic: External genitalia:  no lesions              Urethra:  normal appearing urethra with no masses, tenderness or lesions              Bartholins and Skenes: normal                 Vagina: normal appearing vagina with normal color and discharge, no lesions.               Cervix: no lesions, no cervical motion tenderness               Bimanual Exam:  Uterus:  21 week size, irregular contour, position              Adnexa:possible fullness on the right   PROCEDURE: EMB Consent obtained for  the procedure.  A bivalve speculum was placed in the vagina.  The cervix was grasped with a single tooth tenaculum.  Pipelle was inserted and rotated.  The uterus sound to 9 cm. Adequate specimen was obtained and sent to pathology.  All instruments were removed.  Patient tolerated the procedure well.  To notify patient of the results.           Chaperone was present for exam. S PELVIC COMPLETE WITH TRANSVAGINAL (Accession [id number]) (Order [id number]) Imaging Date: 11/12/2022 Department: [facility] Released By: Nechama Bales Authorizing: Reinaldo Caras, MD   Exam Status  Status  Final [99]   PACS Intelerad Image Link   Show images for US  PELVIC COMPLETE WITH TRANSVAGINAL Study Result  Narrative & Impression  CLINICAL DATA:  evaluate fibroids   EXAM: ULTRASOUND OF PELVIS   TECHNIQUE: Transabdominal and transvaginalultrasound examination of the pelvis was performed including evaluation of the uterus, ovaries, adnexal regions, and pelvic cul-de-sac.   COMPARISON:  None Available.   FINDINGS: Uterusanteverted, 11 x 8 x 4 cm. Endometrium not visualized due to fibroids.   The following uterine fibroids were identified.   1. Pedunculated fundal subserosal, 11 x 9 x 6 cm. 2. Right subserosal, 3.4 x 3.6 x 2.9 cm. 3. Posterior subserosal, 4.1 x 3.6 x 3.2 cm.   Right ovary   Not visualized.   Left ovary   3.8 x 2.5 x 2.4 cm.   Images of the adnexae demonstrated no masses or fluid collections.   IMPRESSION: Uterine fibroids, described above.     Electronically Signed   By: Sydell Eva M.D.   On: 11/30/2022 20:36      Result History  US  PELVIC COMPLETE WITH TRANSVAGINAL (Order #[id number]) on 11/30/2022 - Order Result History Report    A:         Well Woman GYN exam, fibroids, endometriosis                             P:         Symptomatic fibroid uterus:  Counseled on all options.  She would like to have the [facility].  Counseled extensively  on the procedure including but not limited to what to expect and risks and benefits.  Counseled on postop care and pelvic rest for 10 weeks after the surgery with restricted lifting for 6 weeks after.  Counseled on the benefits of the robotic procedure with faster return to daily activities, improved outcomes, and less risk for complications. She would like to have this scheduled.  EMB collected today.  To notify patient of the results. Called peer to peer review for insurance approval, as patient with indicated and necessary treatment with the [facility]. Patient is having unbearable and heavy periods due to endometriosis and enlarging fibroids and has failed medical management.  No answer x3 when called.  Left message.  Darry Endo Mort Smelser  30 minutes spent on reviewing records, imaging,  and one on one patient time and counseling patient and documentation Dr. Tia Flowers

## 2023-03-06 NOTE — H&P (View-Only) (Signed)
 PREOP H&P, EMB 43 y.o. y.o. female here for US  results.  She has known fibroids on CT scan in the past.   Patient reports painful and heavy periods in the past with bleeding for 2 weeks and large clots. She has even bled for a month on ocp's. She does still have pelvic cramping that is bothersome to her as well. US  with large fibroids She is on ocp's since after her surgery in 2013, and has been having irregular periods and pain since. She tried another ocp recently and is still bleeding uncontrollably.  She reports using a super tampon Q 2 hours and can bleed through these.  Has clots as well.  She ilicits urinary symptoms as well. She  She does not desire anymore children and would like to discuss options with the [facility].  She also does not want to be on ocp's for longer than she has to be, and they are not improving her cycle.  H/o Dx lap for cystectomy found endometriosis in [place] in 2013. Report reviewed.  She is on suppression ocp's. Sister with ovarian cancer She limits her activities and stays home because of the bleeding and pain.  She Reports she can bleed through her clothes, and this is embarrassing if she is out in public.  Her periods are affecting the quality of her life and she would like a definitive management.  She would like to move the surgery date up if possible as well.  Sept note not accurate on bleeding and clarified above. Blood pressure 116/86, pulse 89, height 5' 3.75" (1.619 m), weight 205 lb (93 kg), last menstrual period 02/28/2023, SpO2 99%.     Component Value Date/Time   DIAGPAP  11/05/2022 0856    - Negative for intraepithelial lesion or malignancy (NILM)   ADEQPAP Satisfactory for evaluation. 11/05/2022 0856    GYN HISTORY:    Component Value Date/Time   DIAGPAP  11/05/2022 0856    - Negative for intraepithelial lesion or malignancy (NILM)   ADEQPAP Satisfactory for evaluation. 11/05/2022 0856    OB History  Gravida Para Term Preterm AB Living  0 0 0  0 0 0  SAB IAB Ectopic Multiple Live Births  0 0 0 0 0    Past Medical History:  Diagnosis Date   Blood transfusion without reported diagnosis    Endometriosis    Endometriosis    Family history of ovarian cancer    sister   Fibroadenoma    right breat.  Repeat US  q 6 months   Fibroids    Menorrhagia     Past Surgical History:  Procedure Laterality Date   CHOLECYSTECTOMY N/A 03/20/2022   Procedure: LAPAROSCOPIC CHOLECYSTECTOMY;  Surgeon: Adalberto Acton, MD;  Location: [facility];  Service: General;  Laterality: N/A;   LAPAROSCOPIC OVARIAN CYSTECTOMY     in [place]. found endometriosis   OVARIAN CYST REMOVAL Right     Current Outpatient Medications on File Prior to Visit  Medication Sig Dispense Refill   B Complex Vitamins (B COMPLEX PO) Take by mouth.     MAGNESIUM PO Take by mouth.     Menaquinone-7 (K2 PO) Take by mouth.     Multiple Vitamin (MULTIVITAMIN PO) Take by mouth.     Multiple Vitamins-Minerals (ZINC  PO) Take by mouth.     norethindrone  (MICRONOR ) 0.35 MG tablet Take 1 tablet (0.35 mg total) by mouth daily. 84 tablet 2   Probiotic Product (PROBIOTIC PO) Take by mouth.  UNABLE TO FIND Med Name: allertac     VITAMIN D PO Take by mouth.     No current facility-administered medications on file prior to visit.    Social History   Socioeconomic History   Marital status: Single    Spouse name: Not on file   Number of children: Not on file   Years of education: Not on file   Highest education level: Not on file  Occupational History   Not on file  Tobacco Use   Smoking status: Never   Smokeless tobacco: Never  Vaping Use   Vaping status: Never Used  Substance and Sexual Activity   Alcohol use: Yes    Comment: occas   Drug use: Never   Sexual activity: Not Currently    Birth control/protection: OCP    Comment: no preference, never sexually active  Other Topics Concern   Not on file  Social History Narrative   Not on file   Social Drivers of Health    Financial Resource Strain: Not on file  Food Insecurity: Not on file  Transportation Needs: Not on file  Physical Activity: Not on file  Stress: Not on file  Social Connections: Not on file  Intimate Partner Violence: Not on file    Family History  Problem Relation Age of Onset   Diabetes Father    Hypertension Father    Ovarian cancer Sister    Diabetes Paternal Uncle    Thyroid cancer Paternal Uncle    Colon cancer Maternal Grandmother    Skin cancer Maternal Grandfather    Diabetes Paternal Grandmother    Hypertension Paternal Grandmother      Allergies  Allergen Reactions   Sulfa Antibiotics Hives, Itching and Rash       Review of Systems Alls systems reviewed and are negative.     PE General appearance: alert, cooperative and appears stated age Head: Normocephalic, without obvious abnormality, atraumatic Neck: no adenopathy, supple, symmetrical, trachea midline and thyroid normal to inspection and palpation Lungs: clear to auscultation bilaterally Breasts: normal appearance, no masses or tenderness Heart: regular rate and rhythm Abdomen: soft, non-tender; bowel sounds normal; no masses,  no organomegaly Extremities: extremities normal, atraumatic, no cyanosis or edema Skin: Skin color, texture, turgor normal. No rashes or lesions Lymph nodes: Cervical, supraclavicular, and axillary nodes normal. No abnormal inguinal nodes palpated Neurologic: Grossly normal     Pelvic: External genitalia:  no lesions              Urethra:  normal appearing urethra with no masses, tenderness or lesions              Bartholins and Skenes: normal                 Vagina: normal appearing vagina with normal color and discharge, no lesions.               Cervix: no lesions, no cervical motion tenderness               Bimanual Exam:  Uterus:  21 week size, irregular contour, position              Adnexa:possible fullness on the right   PROCEDURE: EMB Consent obtained for  the procedure.  A bivalve speculum was placed in the vagina.  The cervix was grasped with a single tooth tenaculum.  Pipelle was inserted and rotated.  The uterus sound to 9 cm. Adequate specimen was obtained and sent to pathology.  All instruments were removed.  Patient tolerated the procedure well.  To notify patient of the results.           Chaperone was present for exam. S PELVIC COMPLETE WITH TRANSVAGINAL (Accession [id number]) (Order [id number]) Imaging Date: 11/12/2022 Department: [facility] Released By: Nechama Bales Authorizing: Reinaldo Caras, MD   Exam Status  Status  Final [99]   PACS Intelerad Image Link   Show images for US  PELVIC COMPLETE WITH TRANSVAGINAL Study Result  Narrative & Impression  CLINICAL DATA:  evaluate fibroids   EXAM: ULTRASOUND OF PELVIS   TECHNIQUE: Transabdominal and transvaginalultrasound examination of the pelvis was performed including evaluation of the uterus, ovaries, adnexal regions, and pelvic cul-de-sac.   COMPARISON:  None Available.   FINDINGS: Uterusanteverted, 11 x 8 x 4 cm. Endometrium not visualized due to fibroids.   The following uterine fibroids were identified.   1. Pedunculated fundal subserosal, 11 x 9 x 6 cm. 2. Right subserosal, 3.4 x 3.6 x 2.9 cm. 3. Posterior subserosal, 4.1 x 3.6 x 3.2 cm.   Right ovary   Not visualized.   Left ovary   3.8 x 2.5 x 2.4 cm.   Images of the adnexae demonstrated no masses or fluid collections.   IMPRESSION: Uterine fibroids, described above.     Electronically Signed   By: Sydell Eva M.D.   On: 11/30/2022 20:36      Result History  US  PELVIC COMPLETE WITH TRANSVAGINAL (Order #[id number]) on 11/30/2022 - Order Result History Report    A:         Well Woman GYN exam, fibroids, endometriosis                             P:         Symptomatic fibroid uterus:  Counseled on all options.  She would like to have the [facility].  Counseled extensively  on the procedure including but not limited to what to expect and risks and benefits.  Counseled on postop care and pelvic rest for 10 weeks after the surgery with restricted lifting for 6 weeks after.  Counseled on the benefits of the robotic procedure with faster return to daily activities, improved outcomes, and less risk for complications. She would like to have this scheduled.  EMB collected today.  To notify patient of the results. Called peer to peer review for insurance approval, as patient with indicated and necessary treatment with the [facility]. Patient is having unbearable and heavy periods due to endometriosis and enlarging fibroids and has failed medical management.  No answer x3 when called.  Left message.  Darry Endo Mort Smelser  30 minutes spent on reviewing records, imaging,  and one on one patient time and counseling patient and documentation Dr. Tia Flowers

## 2023-03-06 NOTE — Patient Instructions (Signed)
 Robotic Laparoscopic Hysterectomy, Care After  The following information offers guidance on how to care for yourself after your procedure. Your health care provider may also give you more specific instructions. If you have problems or questions, contact your health care provider. What can I expect after the procedure? After the procedure, it is common to have: Pain, bruising, and numbness around your incisions. Tiredness (fatigue). Poor appetite. Less interest in sex. Vaginal discharge or bleeding. You will need to use a sanitary pad after this procedure.  HEAVY BLEEDING LIKE A PERIOD IS NOT NORMAL.  PLEASE CALL YOUR PROVIDER IF SOAKING A PAD. Feelings of sadness or other emotions. If your ovaries were also removed, it is also common to have symptoms of menopause, such as hot flashes, night sweats, and lack of sleep (insomnia).  Ovaries should stay in if at all possible until at least the age of 87. Follow these instructions at home: Medicines Take over-the-counter and prescription medicines only as told by your health care provider. Ask your health care provider if the medicine prescribed to you: Requires you to avoid driving or using machinery. Can cause constipation. You may need to take these actions to prevent or treat constipation: Drink enough fluid to keep your urine pale yellow. Take over-the-counter or prescription medicines. Eat foods that are high in fiber, such as beans, whole grains, and fresh fruits and vegetables. Limit foods that are high in fat and processed sugars, such as fried or sweet foods.  Also, avoid spicy foods.  NAUSEA IS COMMON THE FIRST NIGHT OF SURGERY.  IF IT LASTS BEYOND 24 HOURS, CALL YOUR PROVIDER.  NAUSEA MEDICATION WAS GIVEN AT YOUR PREOP APPOINTMENT THAT YOU CAN TAKE AFTER SURGERY. Incision care  Follow instructions from your health care provider about how to take care of your incisions. Make sure you: LEAVE INCISION OPEN AND DRY-NO BANDAGES Leave  stitches (sutures), skin glue, or adhesive strips in place UNTIL 2 WEEKS THEN REMOVE IN THE SHOWER.  If adhesive strip edges start to loosen and curl up, you may trim the loose edges. Check your incision areas every day for signs of infection. Check for: More redness, swelling, or pain. Fluid or blood. Warmth. Pus or a bad smell. Activity  Rest as told by your health care provider. Avoid sitting for a long time without moving. Get up to take short walks every 1-2 hours. This is important to improve blood flow and breathing. Ask for help if you feel weak or unsteady. Return to your normal activities as told by your health care provider. Ask your health care provider what activities are safe for you. Do not lift anything that is heavier than 10 lb (4.5 kg), or the limit that you are told, for 8 WEEKS after surgery or until your health care provider says that it is safe. If you were given a sedative during the procedure, it can affect you for several hours. Do not drive or operate machinery until your health care provider says that it is safe. Lifestyle Do not use any products that contain nicotine or tobacco. These products include cigarettes, chewing tobacco, and vaping devices, such as e-cigarettes. These can delay healing after surgery. If you need help quitting, ask your health care provider. Do not drink alcohol until your health care provider approves. General instructions FOR 2 WEEKS AFTER SURGERY, THEN YOU MAY USE TUBS AND HOT TUBS Do not douche, use tampons, or have sex for at least 10 weeks, or as told by your health care  provider. If you struggle with physical or emotional changes after your procedure, speak with your health care provider or a therapist. Do not take baths, swim, or use a hot tub until your health care provider approves. You may only be allowed to take showers for 2 weeks. IF YOU HAVE BURNING WITH URINATION, PLEASE CALL YOUR DOCTOR. BLADDER INFECTIONS MAY OCCUR AFTER  SURGERY Try to have someone at home with you for the first 1-2 weeks to help with your daily chores. Wear compression stockings as told by your health care provider. These stockings help to prevent blood clots and reduce swelling in your legs. Keep all follow-up visits. This is important. Contact a health care provider if: You have any of these signs of infection: Chills or a fever 12f OR GREATER. More redness, swelling, or pain around an incision. Fluid or blood coming from an incision. Warmth coming from an incision. Pus or a bad smell coming from an incision. Burning with urination. Urinary frequency or cramping.   IF YOU HAVE THESE SYMPTOMS, PLEASE CALL THE OFFICE TO COME EVALUATE FOR A BLADDER INFECTION AT [phone] An incision opens. You feel dizzy or light-headed. You have pain or bleeding when you urinate, or you are unable to urinate. You have abnormal vaginal discharge. You have pain that does not get better with medicine. Get help right away if: You have a fever and your symptoms suddenly get worse. You have severe abdominal pain. You have chest pain or shortness of breath. You may have chest pain and shortness of breath from the CO2 gas for a few days after surgery.  This is very common.  Walking, Gas-X and motrin will usually help relieve this discomfort You faint. You have pain, swelling, or redness in your leg. You have heavy vaginal bleeding with blood clots, soaking through a sanitary pad in less than 1 hour. These symptoms may represent a serious problem that is an emergency. Do not wait to see if the symptoms will go away. Get medical help right away. Call your local emergency services (911 in the [place] Do not drive yourself to the hospital. Summary  CONSTIPATION MEDICATION AFTER SURGERY: COLACE, MOM, MIRALAX, GAS X are all helpful to have on hand, if needed.  FILL ALL POSTOP MEDICATION BEFORE SURGERY  After the procedure, it is common to have pain and bruising  around your incisions. Do not take baths, swim, or use a hot tub until your health care provider approves. Do not lift anything that is heavier than 8- 10 lb (4.5 kg), or the limit that you are told, for one month after surgery or until your health care provider says that it is safe. Tell your health care provider if you have any signs or symptoms of infection after the procedure. Get help right away if you have severe abdominal pain, chest pain, shortness of breath, or heavy bleeding from your vagina. This information is not intended to replace advice given to you by your  health care provider. Make sure you discuss any questions you have with your health care provider. Document Revised: 10/08/2019 Document Reviewed: 10/09/2019 Elsevier Patient Education  2024 [employer].

## 2023-03-08 LAB — SURGICAL PATHOLOGY

## 2023-03-11 ENCOUNTER — Encounter: Payer: Self-pay | Admitting: Obstetrics and Gynecology

## 2023-03-12 ENCOUNTER — Other Ambulatory Visit: Payer: BC Managed Care – PPO

## 2023-03-13 ENCOUNTER — Other Ambulatory Visit: Payer: BC Managed Care – PPO

## 2023-03-13 LAB — BASIC METABOLIC PANEL
BUN: 11 mg/dL (ref 7–25)
CO2: 25 mmol/L (ref 20–32)
Calcium: 9.2 mg/dL (ref 8.6–10.2)
Chloride: 103 mmol/L (ref 98–110)
Creat: 0.84 mg/dL (ref 0.50–0.99)
Glucose, Bld: 95 mg/dL (ref 65–99)
Potassium: 4.4 mmol/L (ref 3.5–5.3)
Sodium: 137 mmol/L (ref 135–146)

## 2023-03-14 ENCOUNTER — Other Ambulatory Visit: Payer: BC Managed Care – PPO

## 2023-03-14 ENCOUNTER — Encounter: Payer: Self-pay | Admitting: *Deleted

## 2023-03-14 LAB — BASIC METABOLIC PANEL
BUN: 11 mg/dL (ref 7–25)
CO2: 27 mmol/L (ref 20–32)
Calcium: 8.5 mg/dL — ABNORMAL LOW (ref 8.6–10.2)
Chloride: 106 mmol/L (ref 98–110)
Creat: 0.7 mg/dL (ref 0.50–0.99)
Glucose, Bld: 96 mg/dL (ref 65–99)
Potassium: 4.2 mmol/L (ref 3.5–5.3)
Sodium: 139 mmol/L (ref 135–146)

## 2023-03-15 ENCOUNTER — Other Ambulatory Visit: Payer: BC Managed Care – PPO

## 2023-03-15 ENCOUNTER — Other Ambulatory Visit: Payer: Self-pay | Admitting: "Endocrinology

## 2023-03-15 DIAGNOSIS — D3502 Benign neoplasm of left adrenal gland: Secondary | ICD-10-CM

## 2023-03-15 DIAGNOSIS — E269 Hyperaldosteronism, unspecified: Secondary | ICD-10-CM

## 2023-03-15 LAB — BASIC METABOLIC PANEL
BUN: 10 mg/dL (ref 7–25)
CO2: 25 mmol/L (ref 20–32)
Calcium: 9.1 mg/dL (ref 8.6–10.2)
Chloride: 107 mmol/L (ref 98–110)
Creat: 0.76 mg/dL (ref 0.50–0.99)
Glucose, Bld: 96 mg/dL (ref 65–99)
Potassium: 4.5 mmol/L (ref 3.5–5.3)
Sodium: 140 mmol/L (ref 135–146)

## 2023-03-18 ENCOUNTER — Encounter: Payer: Self-pay | Admitting: "Endocrinology

## 2023-03-18 ENCOUNTER — Ambulatory Visit: Payer: BC Managed Care – PPO | Admitting: "Endocrinology

## 2023-03-18 VITALS — BP 106/80 | HR 100 | Resp 20 | Ht 63.75 in | Wt 208.8 lb

## 2023-03-18 DIAGNOSIS — D3502 Benign neoplasm of left adrenal gland: Secondary | ICD-10-CM | POA: Diagnosis not present

## 2023-03-18 DIAGNOSIS — E269 Hyperaldosteronism, unspecified: Secondary | ICD-10-CM

## 2023-03-18 NOTE — Progress Notes (Signed)
Outpatient Endocrinology Note Margaret Farrell [facility], MD    Margaret Farrell Jan 07, 1981 [id number]  Referring Provider: Alysia Penna, MD Primary Care Provider: Alysia Penna, MD Reason for consultation: Subjective   Assessment & Plan  Diagnoses and all orders for this visit:  Hyperaldosteronism (HCC) -     Aldosterone + renin activity w/ ratio  Adenoma of left adrenal gland   Incidental left adrenal adenoma found on CT abdomen pelvis with contrast done in 01/2022 in the ER 01/2022 CT scan Abs/Pel with contrast reported 13 mm nodule in the left adrenal gland 01/2023 CT abd W/WO reported enhancing 13 mm left adrenal nodule is consistent with a benign adrenal adenoma. No further imaging follow-up is required. Patient is otherwise asymptomatic  Ordered baseline adrenal labs: labs WNL except aldosterone elevated at 43 with elevated renin at 7.6-no intake of spironolactone/ACE/ARB Ordered repeat lab: Aldosterone came back again high at 41, with renin at 3 Ordered 3-day salt loading test to assess for primary hyperaldosteronism: aldosterone is pending but urine sodium came back inadequate at 115 Will follow urine aldosterone, however have low suspicion given normal BP even during salt loading, the highest recorded BP by pt was <120/80 Will repeat serum aldosterone before next visit and follow with salt loading if needed   Return for lab before next visit.   I have reviewed current medications, nurse's notes, allergies, vital signs, past medical and surgical history, family medical history, and social history for this encounter. Counseled patient on symptoms, examination findings, lab findings, imaging results, treatment decisions and monitoring and prognosis. The patient understood the recommendations and agrees with the treatment plan. All questions regarding treatment plan were fully answered.  Margaret Farrell [place], MD  03/18/23   History of Present Illness HPI  Margaret Farrell is a 43 y.o.  year old female who presents for evaluation of left adrenal incidentaloma.  Checked BP at home during salt loading  BP WNL, <120/80 checked twice a day for 3 days during salt loading  Did not need any BP med or any potassium replacement  Plans for hysterectomy on 03/26/22 at [facility], history of fibroids and endometriosis   Initial history:   Patient was found to have incidental left adrenal adenoma in CT scan Abs/Pel with contrast done in 01/2022 (Adrenals/Urinary Tract: 13 mm nodule in the left adrenal gland).  No active complaints Patient has history of fibroids, endometriosis and plans for hysterectomy in 03/2023  She weight change No moon face No fat pads Yes +dorsocervical  increased girth Yes plethora No hyperpigmentation No purple striae No proximal muscle weakness No acne No vellus/terminal hirsutism No scalp hair loss No a history of HTN a history of hypokalemia No  paroxysmal episodes of anxiety No tremors No lightheadedness No headache No palpitation No sweating Yes blurry vision No  Physical Exam  BP 106/80 (BP Location: Left Arm, Patient Position: Sitting, Cuff Size: Large)   Pulse 100   Resp 20   Ht 5' 3.75" (1.619 m)   Wt 208 lb 12.8 oz (94.7 kg)   LMP 02/28/2023 (Exact Date)   SpO2 99%   BMI 36.12 kg/m    Constitutional: well developed, well nourished Head: normocephalic, atraumatic Eyes: sclera anicteric, no redness Neck: supple Lungs: normal respiratory effort Neurology: alert and oriented Skin: dry, no appreciable rashes Musculoskeletal: no appreciable defects Psychiatric: normal mood and affect   Current Medications Patient's Medications  New Prescriptions   No medications on file  Previous Medications   B COMPLEX VITAMINS (B COMPLEX  PO)    Take by mouth.   IBUPROFEN (ADVIL) 800 MG TABLET    Take 1 tablet (800 mg total) by mouth every 8 (eight) hours as needed.   MAGNESIUM PO    Take by mouth.   MENAQUINONE-7 (K2 PO)    Take by  mouth.   METOCLOPRAMIDE (REGLAN) 10 MG TABLET    Take 1 tablet (10 mg total) by mouth every 8 (eight) hours as needed for nausea.   MULTIPLE VITAMIN (MULTIVITAMIN PO)    Take by mouth.   MULTIPLE VITAMINS-MINERALS (ZINC PO)    Take by mouth.   NORETHINDRONE (MICRONOR) 0.35 MG TABLET    Take 1 tablet (0.35 mg total) by mouth daily.   OXYCODONE (ROXICODONE) 5 MG IMMEDIATE RELEASE TABLET    Take 1 tablet (5 mg total) by mouth every 4 (four) hours as needed.   PROBIOTIC PRODUCT (PROBIOTIC PO)    Take by mouth.   UNABLE TO FIND    Med Name: allertac   VITAMIN D PO    Take by mouth.  Modified Medications   No medications on file  Discontinued Medications   No medications on file    Allergies Allergies  Allergen Reactions   Sulfa Antibiotics Hives, Itching and Rash    Past Medical History Past Medical History:  Diagnosis Date   Blood transfusion without reported diagnosis    Endometriosis    Endometriosis    Family history of ovarian cancer    sister   Fibroadenoma    right breat.  Repeat US q 6 months   Fibroids    Menorrhagia     Past Surgical History Past Surgical History:  Procedure Laterality Date   CHOLECYSTECTOMY N/A 03/20/2022   Procedure: LAPAROSCOPIC CHOLECYSTECTOMY;  Surgeon: Berna Bue, MD;  Location: [facility];  Service: General;  Laterality: N/A;   LAPAROSCOPIC OVARIAN CYSTECTOMY     in [place]. found endometriosis   OVARIAN CYST REMOVAL Right     Family History family history includes Colon cancer in her maternal grandmother; Diabetes in her father, paternal grandmother, and paternal uncle; Hypertension in her father and paternal grandmother; Ovarian cancer in her sister; Skin cancer in her maternal grandfather; Thyroid cancer in her paternal uncle.  Social History Social History   Socioeconomic History   Marital status: Single    Spouse name: Not on file   Number of children: Not on file   Years of education: Not on file   Highest education level: Not  on file  Occupational History   Not on file  Tobacco Use   Smoking status: Never   Smokeless tobacco: Never  Vaping Use   Vaping status: Never Used  Substance and Sexual Activity   Alcohol use: Yes    Comment: occas   Drug use: Never   Sexual activity: Not Currently    Birth control/protection: OCP    Comment: no preference, never sexually active  Other Topics Concern   Not on file  Social History Narrative   Not on file   Social Drivers of Health   Financial Resource Strain: Not on file  Food Insecurity: Not on file  Transportation Needs: Not on file  Physical Activity: Not on file  Stress: Not on file  Social Connections: Not on file  Intimate Partner Violence: Not on file    No results found for: "CHOL" No results found for: "HDL" No results found for: "LDLCALC" No results found for: "TRIG" No results found for: "CHOLHDL" Lab Results  Component Value Date   CREATININE 0.76 03/14/2023   No results found for: "GFR"    Component Value Date/Time   NA 140 03/14/2023 0804   K 4.5 03/14/2023 0804   CL 107 03/14/2023 0804   CO2 25 03/14/2023 0804   GLUCOSE 96 03/14/2023 0804   BUN 10 03/14/2023 0804   CREATININE 0.76 03/14/2023 0804   CALCIUM 9.1 03/14/2023 0804   PROT 7.6 02/01/2022 0340   ALBUMIN 3.9 02/01/2022 0340   AST 81 (H) 02/01/2022 0340   ALT 53 (H) 02/01/2022 0340   ALKPHOS 55 02/01/2022 0340   BILITOT 0.8 02/01/2022 0340   GFRNONAA >60 02/01/2022 0340      Latest Ref Rng & Units 03/14/2023    8:04 AM 03/13/2023    8:02 AM 03/12/2023    8:03 AM  BMP  Glucose 65 - 99 mg/dL 96  96  95   BUN 7 - 25 mg/dL 10  11  11    Creatinine 0.50 - 0.99 mg/dL 1.61  0.96  0.45   BUN/Creat Ratio 6 - 22 (calc) SEE NOTE:  SEE NOTE:  SEE NOTE:   Sodium 135 - 146 mmol/L 140  139  137   Potassium 3.5 - 5.3 mmol/L 4.5  4.2  4.4   Chloride 98 - 110 mmol/L 107  106  103   CO2 20 - 32 mmol/L 25  27  25    Calcium 8.6 - 10.2 mg/dL 9.1  8.5  9.2        Component Value  Date/Time   WBC 4.1 03/15/2022 0825   RBC 4.97 03/15/2022 0825   HGB 16.7 (H) 03/20/2022 0956   HCT 49.0 (H) 03/20/2022 0956   PLT 255 03/15/2022 0825   MCV 88.1 03/15/2022 0825   MCH 29.0 03/15/2022 0825   MCHC 32.9 03/15/2022 0825   RDW 12.9 03/15/2022 0825   LYMPHSABS 1.9 02/01/2022 0340   MONOABS 0.8 02/01/2022 0340   EOSABS 0.1 02/01/2022 0340   BASOSABS 0.1 02/01/2022 0340   No results found for: "TSH", "FREET4"       Parts of this note may have been dictated using voice recognition software. There may be variances in spelling and vocabulary which are unintentional. Not all errors are proofread. Please notify the There[facility] Parkin if any discrepancies are noted or if the meaning of any statement is not clear.

## 2023-03-21 ENCOUNTER — Encounter (HOSPITAL_BASED_OUTPATIENT_CLINIC_OR_DEPARTMENT_OTHER): Payer: Self-pay | Admitting: Obstetrics and Gynecology

## 2023-03-21 LAB — SODIUM, URINE, TIMED: Sodium, 24H Ur: 115 mmol/(24.h) (ref 52–380)

## 2023-03-21 LAB — ALDOSTERONE, URINE, 24 HOUR
Aldosterone, 24H Ur: 3 ug/(24.h)
Creatinine, Urine mg/day-VMAUR: 0.6 g/(24.h) (ref 0.50–2.15)
Volume, Urine-ALDU: 1000 mL

## 2023-03-21 NOTE — Progress Notes (Addendum)
Spoke w/ via phone for pre-op interview--- pt Lab needs dos----  urine preg       Lab results------ lab appt 03-25-2023 @ 0900 getting CBC/ T&S/ BMP (gent ordered) COVID test -----patient states asymptomatic no test needed Arrive at ------- 0800 on 03-25-2023 NPO after MN  Med rec completed Medications to take morning of surgery ----- none Diabetic medication ----- n/a Patient instructed no nail polish to be worn day of surgery Patient instructed to bring photo id and insurance card day of surgery Patient aware to have Driver (ride ) / caregiver    for 24 hours after surgery - mother, karen Patient Special Instructions ----- will pick bag w/ hibiclens and written instructions at lab appt Asked to call w/ any questions Pre-Op special Instructions ----- n/a Patient verbalized understanding of instructions that were given at this phone interview. Patient denies chest pain, sob, fever, cough at the interview.

## 2023-03-21 NOTE — Progress Notes (Signed)
Your procedure is scheduled on :  Wednesday,  03-27-2023  Report to [facility] [place] AT  __8:00_ AM.   Call this number if you have problems the morning of surger :[phone] Any questions prior to surgery call pre-op nurse,  Keivon Garden :  [phone]   OUR ADDRESS IS [address].  WE ARE LOCATED IN [facility]  PLEASE BRING YOUR INSURANCE CARD AND PHOTO ID DAY OF SURGERY.                                     REMEMBER:  Do not eat any food and do not drink any liquid after midnight night before surgery.  This includes no water,  candy,  gum,  and  mints.   Please brush your teeth morning of surgery and rinse your mouth out. _____________________________________________________________________     TAKE ONLY THESE MEDICATIONS MORNING OF SURGERY:  NONE                                        DO NOT WEAR JEWERLY/  METAL/  PIERCINGS (INCLUDING NO PLASTIC PIERCINGS) DO NOT WEAR LOTIONS, POWDERS, PERFUMES OR NAIL POLISH ON YOUR FINGERNAILS. TOENAIL POLISH IS OK TO WEAR. DO NOT SHAVE FOR 48 HOURS PRIOR TO DAY OF SURGERY.  CONTACTS, GLASSES, OR DENTURES MAY NOT BE WORN TO SURGERY.  REMEMBER: NO SMOKING, VAPING ,  DRUGS OR ALCOHOL FOR 24 HOURS BEFORE YOUR SURGERY.                                    [place] [facility] IS NOT RESPONSIBLE  FOR ANY BELONGINGS.                                                                    Marland Kitchen           [facility] - Preparing for Surgery Before surgery, you can play an important role.  Because skin is not sterile, your skin needs to be as free of germs as possible.  You can reduce the number of germs on your skin by washing with CHG (chlorahexidine gluconate) soap before surgery.  CHG is an antiseptic cleaner which kills germs and bonds with the skin to continue killing germs even after washing. Please DO NOT use if you have an allergy to CHG or antibacterial soaps.  If your skin becomes reddened/irritated stop using  the CHG and inform your nurse when you arrive at Short Stay. Do not shave (including legs and underarms) for at least 48 hours prior to the first CHG shower.  You may shave your face/neck. Please follow these instructions carefully:  1.  Shower with CHG Soap the night before surgery and the  morning of Surgery.  2.  If you choose to wash your hair, wash your hair first as usual with your  normal  shampoo.  3.  After you shampoo, rinse your hair and body thoroughly to remove the  shampoo.  4.  Use CHG as you would any other liquid soap.  You can apply chg directly  to the skin and wash , chg soap provided, night before and morning of your surgery.  5.  Apply the CHG Soap to your body ONLY FROM THE NECK DOWN.   Do not use on face/ open                           Wound or open sores. Avoid contact with eyes, ears mouth and genitals (private parts).                       Wash face,  Genitals (private parts) with your normal soap.             6.  Wash thoroughly, paying special attention to the area where your surgery  will be performed.  7.  Thoroughly rinse your body with warm water from the neck down.  8.  DO NOT shower/wash with your normal soap after using and rinsing off  the CHG Soap.             9.  Pat yourself dry with a clean towel.            10.  Wear clean pajamas.            11.  Place clean sheets on your bed the night of your first shower and do not  sleep with pets. Day of Surgery : Do not apply any lotions/ powders the morning of surgery.  Please wear clean clothes to the hospital/surgery center.  IF YOU HAVE ANY SKIN IRRITATION OR PROBLEMS WITH THE SURGICAL SOAP, PLEASE GET A BAR OF GOLD DIAL SOAP AND SHOWER THE NIGHT BEFORE YOUR SURGERY AND THE MORNING OF YOUR SURGERY. PLEASE LET THE NURSE KNOW MORNING OF YOUR SURGERY IF YOU HAD ANY PROBLEMS WITH THE SURGICAL SOAP.   YOUR SURGEON MAY HAVE REQUESTED EXTENDED RECOVERY TIME AFTER YOUR SURGERY.  IT COULD BE A  JUST A FEW HOURS  UP TO AN OVERNIGHT STAY.  YOUR SURGEON SHOULD HAVE DISCUSSED THIS WITH YOU PRIOR TO YOUR SURGERY. IN THE EVENT YOU NEED TO STAY OVERNIGHT PLEASE REFER TO THE FOLLOWING GUIDELINES. YOU MAY HAVE UP TO 4 VISITORS  MAY VISIT IN THE EXTENDED RECOVERY ROOM UNTIL 800 PM ONLY.  ONE  VISITOR AGE 10 AND OVER MAY SPEND THE NIGHT AND MUST BE IN EXTENDED RECOVERY ROOM NO LATER THAN 800 PM . YOUR DISCHARGE TIME AFTER YOU SPEND THE NIGHT IS 900 AM THE MORNING AFTER YOUR SURGERY. YOU MAY PACK A SMALL OVERNIGHT BAG WITH TOILETRIES FOR YOUR OVERNIGHT STAY IF YOU WISH.  REGARDLESS OF IF YOU STAY OVER NIGHT OR ARE DISCHARGED THE SAME DAY YOU WILL BE REQUIRED TO HAVE A RESPONSIBLE ADULT (18 YRS OLD OR OLDER) STAY WITH YOU FOR AT LEAST THE FIRST 76 HOURS WHEN HOME.  YOUR PRESCRIPTION MEDICATIONS WILL BE PROVIDED DURING [facility] STAY.  ________________________________________________________________________

## 2023-03-22 ENCOUNTER — Encounter: Payer: Self-pay | Admitting: "Endocrinology

## 2023-03-25 ENCOUNTER — Encounter (HOSPITAL_COMMUNITY)
Admission: RE | Admit: 2023-03-25 | Discharge: 2023-03-25 | Disposition: A | Discharge status: Home / Self Care | Payer: BC Managed Care – PPO | Source: Ambulatory Visit | Attending: Obstetrics and Gynecology | Admitting: Obstetrics and Gynecology

## 2023-03-25 DIAGNOSIS — Z01812 Encounter for preprocedural laboratory examination: Secondary | ICD-10-CM | POA: Diagnosis present

## 2023-03-25 DIAGNOSIS — Z01818 Encounter for other preprocedural examination: Secondary | ICD-10-CM

## 2023-03-25 LAB — CBC
HCT: 45.2 % (ref 36.0–46.0)
Hemoglobin: 14.9 g/dL (ref 12.0–15.0)
MCH: 29.3 pg (ref 26.0–34.0)
MCHC: 33 g/dL (ref 30.0–36.0)
MCV: 88.8 fL (ref 80.0–100.0)
Platelets: 235 10*3/uL (ref 150–400)
RBC: 5.09 MIL/uL (ref 3.87–5.11)
RDW: 13.3 % (ref 11.5–15.5)
WBC: 5.3 10*3/uL (ref 4.0–10.5)
nRBC: 0 % (ref 0.0–0.2)

## 2023-03-25 LAB — BASIC METABOLIC PANEL
Anion gap: 7 (ref 5–15)
BUN: 11 mg/dL (ref 6–20)
CO2: 23 mmol/L (ref 22–32)
Calcium: 8.8 mg/dL — ABNORMAL LOW (ref 8.9–10.3)
Chloride: 106 mmol/L (ref 98–111)
Creatinine, Ser: 0.65 mg/dL (ref 0.44–1.00)
GFR, Estimated: >60 mL/min (ref >60)
Glucose, Bld: 102 mg/dL — ABNORMAL HIGH (ref 70–99)
Potassium: 4.6 mmol/L (ref 3.5–5.1)
Sodium: 136 mmol/L (ref 135–145)

## 2023-03-27 ENCOUNTER — Other Ambulatory Visit: Payer: Self-pay

## 2023-03-27 ENCOUNTER — Encounter (HOSPITAL_BASED_OUTPATIENT_CLINIC_OR_DEPARTMENT_OTHER)
Admission: RE | Disposition: A | Discharge status: Home / Self Care | Payer: Self-pay | Source: Home / Self Care | Attending: Obstetrics and Gynecology

## 2023-03-27 ENCOUNTER — Ambulatory Visit (HOSPITAL_BASED_OUTPATIENT_CLINIC_OR_DEPARTMENT_OTHER)
Admission: RE | Admit: 2023-03-27 | Discharge: 2023-03-27 | Disposition: A | Discharge status: Home / Self Care | Payer: BC Managed Care – PPO | Attending: Obstetrics and Gynecology | Admitting: Obstetrics and Gynecology

## 2023-03-27 ENCOUNTER — Ambulatory Visit (HOSPITAL_BASED_OUTPATIENT_CLINIC_OR_DEPARTMENT_OTHER): Payer: BC Managed Care – PPO | Admitting: Anesthesiology

## 2023-03-27 ENCOUNTER — Encounter (HOSPITAL_BASED_OUTPATIENT_CLINIC_OR_DEPARTMENT_OTHER): Payer: Self-pay | Admitting: Obstetrics and Gynecology

## 2023-03-27 DIAGNOSIS — D649 Anemia, unspecified: Secondary | ICD-10-CM | POA: Insufficient documentation

## 2023-03-27 DIAGNOSIS — Z6835 Body mass index (BMI) 35.0-35.9, adult: Secondary | ICD-10-CM | POA: Diagnosis not present

## 2023-03-27 DIAGNOSIS — N809 Endometriosis, unspecified: Secondary | ICD-10-CM | POA: Diagnosis not present

## 2023-03-27 DIAGNOSIS — D219 Benign neoplasm of connective and other soft tissue, unspecified: Secondary | ICD-10-CM

## 2023-03-27 DIAGNOSIS — E669 Obesity, unspecified: Secondary | ICD-10-CM | POA: Diagnosis not present

## 2023-03-27 DIAGNOSIS — D3502 Benign neoplasm of left adrenal gland: Secondary | ICD-10-CM | POA: Insufficient documentation

## 2023-03-27 DIAGNOSIS — N3941 Urge incontinence: Secondary | ICD-10-CM | POA: Diagnosis not present

## 2023-03-27 DIAGNOSIS — X58XXXA Exposure to other specified factors, initial encounter: Secondary | ICD-10-CM | POA: Insufficient documentation

## 2023-03-27 DIAGNOSIS — N946 Dysmenorrhea, unspecified: Secondary | ICD-10-CM | POA: Insufficient documentation

## 2023-03-27 DIAGNOSIS — N803 Endometriosis of pelvic peritoneum, unspecified: Secondary | ICD-10-CM | POA: Diagnosis not present

## 2023-03-27 DIAGNOSIS — N9489 Other specified conditions associated with female genital organs and menstrual cycle: Secondary | ICD-10-CM | POA: Diagnosis not present

## 2023-03-27 DIAGNOSIS — N80203 Endometriosis of bilateral fallopian tubes, unspecified depth: Secondary | ICD-10-CM | POA: Diagnosis not present

## 2023-03-27 DIAGNOSIS — D251 Intramural leiomyoma of uterus: Secondary | ICD-10-CM | POA: Diagnosis not present

## 2023-03-27 DIAGNOSIS — N921 Excessive and frequent menstruation with irregular cycle: Secondary | ICD-10-CM | POA: Diagnosis not present

## 2023-03-27 DIAGNOSIS — S3141XA Laceration without foreign body of vagina and vulva, initial encounter: Secondary | ICD-10-CM | POA: Insufficient documentation

## 2023-03-27 DIAGNOSIS — E269 Hyperaldosteronism, unspecified: Secondary | ICD-10-CM | POA: Insufficient documentation

## 2023-03-27 DIAGNOSIS — Z01818 Encounter for other preprocedural examination: Secondary | ICD-10-CM

## 2023-03-27 HISTORY — PX: CYSTOSCOPY: SHX5120

## 2023-03-27 HISTORY — DX: Excessive and frequent menstruation with irregular cycle: N92.1

## 2023-03-27 HISTORY — DX: Leiomyoma of uterus, unspecified: D25.9

## 2023-03-27 HISTORY — DX: Presence of spectacles and contact lenses: Z97.3

## 2023-03-27 HISTORY — DX: Anemia, unspecified: D64.9

## 2023-03-27 HISTORY — DX: Hyperaldosteronism, unspecified: E26.9

## 2023-03-27 HISTORY — PX: OVARIAN CYST REMOVAL: SHX89

## 2023-03-27 HISTORY — PX: PERINEAL LACERATION REPAIR: SHX5389

## 2023-03-27 HISTORY — PX: ROBOTIC ASSISTED LAPAROSCOPIC HYSTERECTOMY AND SALPINGECTOMY: SHX6379

## 2023-03-27 HISTORY — DX: Dysmenorrhea, unspecified: N94.6

## 2023-03-27 LAB — ABO/RH: ABO/RH(D): B POS

## 2023-03-27 LAB — TYPE AND SCREEN
ABO/RH(D): B POS
Antibody Screen: NEGATIVE

## 2023-03-27 LAB — POCT PREGNANCY, URINE: Preg Test, Ur: NEGATIVE

## 2023-03-27 SURGERY — XI ROBOTIC ASSISTED LAPAROSCOPIC HYSTERECTOMY AND SALPINGECTOMY
Anesthesia: General | Site: Vagina

## 2023-03-27 MED ORDER — OXYCODONE HCL 5 MG PO TABS: 5.0000 mg | ORAL_TABLET | ORAL | Status: DC | PRN

## 2023-03-27 MED ORDER — DIPHENHYDRAMINE HCL 50 MG/ML IJ SOLN
INTRAMUSCULAR | Status: DC | PRN
  Administered 2023-03-27: 25 mg via INTRAVENOUS

## 2023-03-27 MED ORDER — HYDROMORPHONE HCL 1 MG/ML IJ SOLN: 0.2000 mg | INTRAMUSCULAR | Status: DC | PRN

## 2023-03-27 MED ORDER — DIPHENHYDRAMINE HCL 50 MG/ML IJ SOLN
INTRAMUSCULAR | Status: AC
Start: 2023-03-27 — End: ?
  Filled 2023-03-27: qty 1

## 2023-03-27 MED ORDER — OXYCODONE HCL 5 MG/5ML PO SOLN: 5.0000 mg | Freq: Once | ORAL | Status: AC | PRN

## 2023-03-27 MED ORDER — ONDANSETRON HCL 4 MG/2ML IJ SOLN
INTRAMUSCULAR | Status: DC | PRN
  Administered 2023-03-27: 4 mg via INTRAVENOUS

## 2023-03-27 MED ORDER — STERILE WATER FOR IRRIGATION IR SOLN
Status: DC | PRN
  Administered 2023-03-27: 500 mL

## 2023-03-27 MED ORDER — SODIUM CHLORIDE 0.9 % IR SOLN
Status: DC | PRN
  Administered 2023-03-27: 200 mL

## 2023-03-27 MED ORDER — ACETAMINOPHEN 500 MG PO TABS
ORAL_TABLET | ORAL | Status: AC
  Filled 2023-03-27: qty 2

## 2023-03-27 MED ORDER — FENTANYL CITRATE (PF) 100 MCG/2ML IJ SOLN
INTRAMUSCULAR | Status: AC
  Filled 2023-03-27: qty 2

## 2023-03-27 MED ORDER — OXYCODONE HCL 5 MG PO TABS
ORAL_TABLET | ORAL | Status: AC
  Filled 2023-03-27: qty 1

## 2023-03-27 MED ORDER — PANTOPRAZOLE SODIUM 40 MG IV SOLR: 40.0000 mg | Freq: Every day | INTRAVENOUS | Status: DC

## 2023-03-27 MED ORDER — CEFOXITIN SODIUM 2 G IV SOLR
INTRAVENOUS | Status: AC
  Filled 2023-03-27: qty 2

## 2023-03-27 MED ORDER — FENTANYL CITRATE (PF) 100 MCG/2ML IJ SOLN
INTRAMUSCULAR | Status: DC | PRN
  Administered 2023-03-27: 100 ug via INTRAVENOUS
  Administered 2023-03-27: 50 ug via INTRAVENOUS
  Administered 2023-03-27: 25 ug via INTRAVENOUS
  Administered 2023-03-27 (×2): 50 ug via INTRAVENOUS
  Administered 2023-03-27: 25 ug via INTRAVENOUS
  Administered 2023-03-27: 50 ug via INTRAVENOUS

## 2023-03-27 MED ORDER — SODIUM CHLORIDE 0.9 % IV SOLN
2.0000 g | INTRAVENOUS | Status: AC
  Administered 2023-03-27: 2 g via INTRAVENOUS

## 2023-03-27 MED ORDER — ACETAMINOPHEN 325 MG PO TABS
ORAL_TABLET | ORAL | Status: AC
Start: 2023-03-27 — End: ?
  Filled 2023-03-27: qty 2

## 2023-03-27 MED ORDER — PROPOFOL 10 MG/ML IV BOLUS
INTRAVENOUS | Status: DC | PRN
  Administered 2023-03-27: 200 mg via INTRAVENOUS

## 2023-03-27 MED ORDER — ONDANSETRON HCL 4 MG PO TABS: 4.0000 mg | ORAL_TABLET | Freq: Four times a day (QID) | ORAL | Status: DC | PRN

## 2023-03-27 MED ORDER — SODIUM CHLORIDE 0.9 % IV SOLN
Freq: Once | INTRAVENOUS | Status: AC
  Administered 2023-03-27: 1000 mL
  Filled 2023-03-27 (×2): qty 10

## 2023-03-27 MED ORDER — SUGAMMADEX SODIUM 200 MG/2ML IV SOLN
INTRAVENOUS | Status: DC | PRN
  Administered 2023-03-27: 200 mg via INTRAVENOUS
  Administered 2023-03-27: 50 mg via INTRAVENOUS

## 2023-03-27 MED ORDER — GABAPENTIN 300 MG PO CAPS
300.0000 mg | ORAL_CAPSULE | ORAL | Status: AC
Start: 2023-03-27 — End: 2023-03-27
  Administered 2023-03-27: 300 mg via ORAL

## 2023-03-27 MED ORDER — DEXAMETHASONE SODIUM PHOSPHATE 10 MG/ML IJ SOLN
INTRAMUSCULAR | Status: AC
  Filled 2023-03-27: qty 1

## 2023-03-27 MED ORDER — CLINDAMYCIN PHOSPHATE 900 MG/50ML IV SOLN
INTRAVENOUS | Status: AC
  Filled 2023-03-27: qty 50

## 2023-03-27 MED ORDER — ACETAMINOPHEN 325 MG PO TABS
650.0000 mg | ORAL_TABLET | ORAL | Status: DC | PRN
  Administered 2023-03-27: 650 mg via ORAL

## 2023-03-27 MED ORDER — ACETAMINOPHEN 500 MG PO TABS
1000.0000 mg | ORAL_TABLET | ORAL | Status: AC
Start: 2023-03-27 — End: 2023-03-27
  Administered 2023-03-27: 1000 mg via ORAL

## 2023-03-27 MED ORDER — MIDAZOLAM HCL 2 MG/2ML IJ SOLN
INTRAMUSCULAR | Status: AC
  Filled 2023-03-27: qty 2

## 2023-03-27 MED ORDER — HEMOSTATIC AGENTS (NO CHARGE) OPTIME
TOPICAL | Status: DC | PRN
  Administered 2023-03-27: 1 via TOPICAL

## 2023-03-27 MED ORDER — CLINDAMYCIN PHOSPHATE 900 MG/50ML IV SOLN: 900.0000 mg | INTRAVENOUS | Status: DC

## 2023-03-27 MED ORDER — SODIUM CHLORIDE 0.9 % IV SOLN
INTRAVENOUS | Status: AC
Start: 2023-03-27 — End: ?
  Filled 2023-03-27: qty 100

## 2023-03-27 MED ORDER — ROCURONIUM BROMIDE 100 MG/10ML IV SOLN
INTRAVENOUS | Status: DC | PRN
  Administered 2023-03-27: 60 mg via INTRAVENOUS
  Administered 2023-03-27 (×3): 20 mg via INTRAVENOUS

## 2023-03-27 MED ORDER — ONDANSETRON HCL 4 MG/2ML IJ SOLN
INTRAMUSCULAR | Status: AC
  Filled 2023-03-27: qty 2

## 2023-03-27 MED ORDER — BUPIVACAINE HCL (PF) 0.5 % IJ SOLN
INTRAMUSCULAR | Status: DC | PRN
  Administered 2023-03-27: 20 mL

## 2023-03-27 MED ORDER — ROCURONIUM BROMIDE 10 MG/ML (PF) SYRINGE
PREFILLED_SYRINGE | INTRAVENOUS | Status: AC
Start: 2023-03-27 — End: ?
  Filled 2023-03-27: qty 10

## 2023-03-27 MED ORDER — OXYCODONE HCL 5 MG PO TABS
5.0000 mg | ORAL_TABLET | Freq: Once | ORAL | Status: AC | PRN
  Administered 2023-03-27: 5 mg via ORAL

## 2023-03-27 MED ORDER — POVIDONE-IODINE 10 % EX SWAB
2.0000 | Freq: Once | CUTANEOUS | Status: AC
  Administered 2023-03-27: 2 via TOPICAL

## 2023-03-27 MED ORDER — GABAPENTIN 300 MG PO CAPS
ORAL_CAPSULE | ORAL | Status: AC
Start: 2023-03-27 — End: ?
  Filled 2023-03-27: qty 1

## 2023-03-27 MED ORDER — GENTAMICIN SULFATE 40 MG/ML IJ SOLN
360.0000 mg | INTRAVENOUS | Status: DC
  Filled 2023-03-27: qty 9

## 2023-03-27 MED ORDER — MIDAZOLAM HCL 5 MG/5ML IJ SOLN
INTRAMUSCULAR | Status: DC | PRN
  Administered 2023-03-27: 2 mg via INTRAVENOUS

## 2023-03-27 MED ORDER — LACTATED RINGERS IV SOLN
INTRAVENOUS | Status: DC
  Administered 2023-03-27: 1000 mL via INTRAVENOUS

## 2023-03-27 MED ORDER — FENTANYL CITRATE (PF) 250 MCG/5ML IJ SOLN
INTRAMUSCULAR | Status: AC
  Filled 2023-03-27: qty 5

## 2023-03-27 MED ORDER — KETOROLAC TROMETHAMINE 30 MG/ML IJ SOLN
INTRAMUSCULAR | Status: DC | PRN
  Administered 2023-03-27: 30 mg via INTRAVENOUS

## 2023-03-27 MED ORDER — KETOROLAC TROMETHAMINE 30 MG/ML IJ SOLN
INTRAMUSCULAR | Status: AC
  Filled 2023-03-27: qty 1

## 2023-03-27 MED ORDER — GABAPENTIN 100 MG PO CAPS: 100.0000 mg | ORAL_CAPSULE | Freq: Once | ORAL | Status: DC

## 2023-03-27 MED ORDER — FENTANYL CITRATE (PF) 100 MCG/2ML IJ SOLN: 25.0000 ug | INTRAMUSCULAR | Status: DC | PRN

## 2023-03-27 MED ORDER — SODIUM CHLORIDE 0.9 % IV SOLN: 12.5000 mg | INTRAVENOUS | Status: DC | PRN

## 2023-03-27 MED ORDER — ONDANSETRON HCL 4 MG/2ML IJ SOLN: 4.0000 mg | Freq: Four times a day (QID) | INTRAMUSCULAR | Status: DC | PRN

## 2023-03-27 MED ORDER — LIDOCAINE HCL (PF) 2 % IJ SOLN
INTRAMUSCULAR | Status: AC
  Filled 2023-03-27: qty 5

## 2023-03-27 MED ORDER — SCOPOLAMINE 1 MG/3DAYS TD PT72: 1.0000 | MEDICATED_PATCH | TRANSDERMAL | Status: DC

## 2023-03-27 MED ORDER — AMISULPRIDE (ANTIEMETIC) 5 MG/2ML IV SOLN: 10.0000 mg | Freq: Once | INTRAVENOUS | Status: DC | PRN

## 2023-03-27 MED ORDER — DEXAMETHASONE SODIUM PHOSPHATE 4 MG/ML IJ SOLN
INTRAMUSCULAR | Status: DC | PRN
  Administered 2023-03-27: 5 mg via INTRAVENOUS

## 2023-03-27 MED ORDER — IBUPROFEN 200 MG PO TABS: 600.0000 mg | ORAL_TABLET | Freq: Four times a day (QID) | ORAL | Status: DC

## 2023-03-27 SURGICAL SUPPLY — 57 items
APPLICATOR ARISTA FLEXITIP XL (MISCELLANEOUS) IMPLANT
BAG LAPAROSCOPIC 12 15 PORT 16 (BASKET) IMPLANT
BAG RETRIEVAL 12/15 (BASKET) ×8
BLADE SURG 10 STRL SS (BLADE) IMPLANT
CATH FOLEY 3WAY 5CC 16FR (CATHETERS) ×4 IMPLANT
COVER BACK TABLE 60X90IN (DRAPES) ×4 IMPLANT
COVER TIP SHEARS 8 DVNC (MISCELLANEOUS) ×4 IMPLANT
DEFOGGER SCOPE WARMER CLEARIFY (MISCELLANEOUS) ×4 IMPLANT
DERMABOND ADVANCED .7 DNX12 (GAUZE/BANDAGES/DRESSINGS) ×4 IMPLANT
DRAPE ARM DVNC X/XI (DISPOSABLE) ×16 IMPLANT
DRAPE COLUMN DVNC XI (DISPOSABLE) ×4 IMPLANT
DRAPE UTILITY XL STRL (DRAPES) ×4 IMPLANT
DRIVER NDL MEGA SUTCUT DVNCXI (INSTRUMENTS) IMPLANT
DRIVER NDLE MEGA SUTCUT DVNCXI (INSTRUMENTS) ×4
DURAPREP 26ML APPLICATOR (WOUND CARE) ×4 IMPLANT
ELECT REM PT RETURN 9FT ADLT (ELECTROSURGICAL) ×4
ELECTRODE REM PT RTRN 9FT ADLT (ELECTROSURGICAL) ×4 IMPLANT
FORCEPS PROGRASP DVNC XI (FORCEP) IMPLANT
GAUZE 4X4 16PLY ~~LOC~~+RFID DBL (SPONGE) IMPLANT
GLOVE BIO SURGEON STRL SZ7 (GLOVE) IMPLANT
GLOVE BIOGEL PI IND STRL 6 (GLOVE) IMPLANT
GLOVE BIOGEL PI IND STRL 6.5 (GLOVE) IMPLANT
GLOVE BIOGEL PI IND STRL 7.0 (GLOVE) IMPLANT
GLOVE NEODERM STER SZ 7 (GLOVE) ×12 IMPLANT
GLOVE SURG SS PI 6.5 STRL IVOR (GLOVE) IMPLANT
GLOVE SURG SS PI 7.0 STRL IVOR (GLOVE) IMPLANT
GLOVE SURG SS PI 7.5 STRL IVOR (GLOVE) IMPLANT
GOWN STRL REUS W/ TWL LRG LVL3 (GOWN DISPOSABLE) IMPLANT
HEMOSTAT ARISTA ABSORB 3G PWDR (HEMOSTASIS) IMPLANT
HIBICLENS CHG 4% 4OZ BTL (MISCELLANEOUS) ×8 IMPLANT
IRRIG SUCT STRYKERFLOW 2 WTIP (MISCELLANEOUS) ×4
IRRIGATION SUCT STRKRFLW 2 WTP (MISCELLANEOUS) ×4 IMPLANT
IV NS 1000ML BAXH (IV SOLUTION) IMPLANT
KIT PINK PAD W/HEAD ARE REST (MISCELLANEOUS) ×4
KIT PINK PAD W/HEAD ARM REST (MISCELLANEOUS) ×4 IMPLANT
KIT TURNOVER CYSTO (KITS) ×4 IMPLANT
LEGGING LITHOTOMY PAIR STRL (DRAPES) ×4 IMPLANT
MANIFOLD NEPTUNE II (INSTRUMENTS) ×4 IMPLANT
OBTURATOR OPTICAL STND 8 DVNC (TROCAR) ×4
OBTURATOR OPTICALSTD 8 DVNC (TROCAR) ×4 IMPLANT
PACK ROBOT WH (CUSTOM PROCEDURE TRAY) ×4 IMPLANT
PACK ROBOTIC GOWN (GOWN DISPOSABLE) ×4 IMPLANT
PAD OB MATERNITY 4.3X12.25 (PERSONAL CARE ITEMS) ×4 IMPLANT
SCISSORS MNPLR CVD DVNC XI (INSTRUMENTS) IMPLANT
SEAL UNIV 5-12 XI (MISCELLANEOUS) ×12 IMPLANT
SEALER VESSEL EXT DVNC XI (MISCELLANEOUS) IMPLANT
SET IRRIG Y TYPE TUR BLADDER L (SET/KITS/TRAYS/PACK) ×4 IMPLANT
SET TUBE SMOKE EVAC HIGH FLOW (TUBING) ×4 IMPLANT
SOL PREP POV-IOD 4OZ 10% (MISCELLANEOUS) ×4 IMPLANT
SPIKE FLUID TRANSFER (MISCELLANEOUS) ×4 IMPLANT
SUT MNCRL AB 4-0 PS2 18 (SUTURE) ×4 IMPLANT
SUT VLOC 180 0 6IN GS21 (SUTURE) IMPLANT
SUT VLOC 180 0 9IN GS21 (SUTURE) ×4 IMPLANT
TIP RUMI ORANGE 6.7MMX12CM (TIP) IMPLANT
TOWEL OR 17X24 6PK STRL BLUE (TOWEL DISPOSABLE) ×4 IMPLANT
UNDERPAD 30X36 HEAVY ABSORB (UNDERPADS AND DIAPERS) ×4 IMPLANT
WATER STERILE IRR 500ML POUR (IV SOLUTION) ×4 IMPLANT

## 2023-03-27 NOTE — Anesthesia Preprocedure Evaluation (Addendum)
 Anesthesia Evaluation  Patient identified by MRN, date of birth, ID band Patient awake    Reviewed: Allergy & Precautions, NPO status , Patient's Chart, lab work & pertinent test results  History of Anesthesia Complications Negative for: history of anesthetic complications  Airway Mallampati: II  TM Distance: >3 FB Neck ROM: Full    Dental  (+) Dental Advisory Given   Pulmonary neg pulmonary ROS   Pulmonary exam normal        Cardiovascular negative cardio ROS Normal cardiovascular exam     Neuro/Psych negative neurological ROS  negative psych ROS   GI/Hepatic negative GI ROS, Neg liver ROS,,,  Endo/Other   Hyperaldosteronism Obesity   Renal/GU  Left adrenal adenoma   Female GU complaint     Musculoskeletal negative musculoskeletal ROS (+)    Abdominal   Peds  Hematology negative hematology ROS (+)   Anesthesia Other Findings   Reproductive/Obstetrics  Endometriosis, Fibroids, Menorrhagia with irregular cycle, Dysmenorrhea                              Anesthesia Physical Anesthesia Plan  ASA: 2  Anesthesia Plan: General   Post-op Pain Management: Tylenol  PO (pre-op)*   Induction: Intravenous  PONV Risk Score and Plan: 3 and Treatment may vary due to age or medical condition, Ondansetron , Dexamethasone , Midazolam  and Scopolamine  patch - Pre-op  Airway Management Planned: Oral ETT  Additional Equipment: None  Intra-op Plan:   Post-operative Plan: Extubation in OR  Informed Consent: I have reviewed the patients History and Physical, chart, labs and discussed the procedure including the risks, benefits and alternatives for the proposed anesthesia with the patient or authorized representative who has indicated his/her understanding and acceptance.     Dental advisory given  Plan Discussed with: CRNA and Anesthesiologist  Anesthesia Plan Comments:         Anesthesia Quick Evaluation

## 2023-03-27 NOTE — Discharge Instructions (Addendum)
 No acetaminophen /Tylenol  until after 9:30 pm today if needed. No ibuprofen , Advil , Aleve, Motrin , ketorolac , meloxicam, naproxen, or other NSAIDS until after 8:15 pm today if needed.   Post Anesthesia Home Care Instructions  Activity: Get plenty of rest for the remainder of the day. A responsible individual must stay with you for 24 hours following the procedure.  For the next 24 hours, DO NOT: -Drive a car -Advertising copywriter -Drink alcoholic beverages -Take any medication unless instructed by your physician -Make any legal decisions or sign important papers.  Meals: Start with liquid foods such as gelatin or soup. Progress to regular foods as tolerated. Avoid greasy, spicy, heavy foods. If nausea and/or vomiting occur, drink only clear liquids until the nausea and/or vomiting subsides. Call your physician if vomiting continues.  Special Instructions/Symptoms: Your throat may feel dry or sore from the anesthesia or the breathing tube placed in your throat during surgery. If this causes discomfort, gargle with warm salt water . The discomfort should disappear within 24 hours.  If you had a scopolamine  patch placed behind your ear for the management of post- operative nausea and/or vomiting:  1. The medication in the patch is effective for 72 hours, after which it should be removed.  Wrap patch in a tissue and discard in the trash. Wash hands thoroughly with soap and water . 2. You may remove the patch earlier than 72 hours if you experience unpleasant side effects which may include dry mouth, dizziness or visual disturbances. 3. Avoid touching the patch. Wash your hands with soap and water  after contact with the patch.

## 2023-03-27 NOTE — Anesthesia Postprocedure Evaluation (Signed)
 Anesthesia Post Note  Patient: Margaret Farrell  Procedure(s) Performed: XI ROBOTIC ASSISTED LAPAROSCOPIC HYSTERECTOMY AND SALPINGECTOMY, (Bilateral: Pelvis) CYSTOSCOPY (Urethra) peritoneal stripping with endmotriosis lesion (Pelvis) SUTURE REPAIR PERINEAL LACERATION (Vagina )     Patient location during evaluation: PACU Anesthesia Type: General Level of consciousness: awake and alert Pain management: pain level controlled Vital Signs Assessment: post-procedure vital signs reviewed and stable Respiratory status: spontaneous breathing, nonlabored ventilation and respiratory function stable Cardiovascular status: blood pressure returned to baseline Postop Assessment: no apparent nausea or vomiting Anesthetic complications: no   No notable events documented.  Last Vitals:  Vitals:   03/27/23 1500 03/27/23 1615  BP: 136/88 126/75  Pulse: 81 90  Resp: 13 15  Temp:  (!) 36.4 C  SpO2: 96% 100%    Last Pain:  Vitals:   03/27/23 1700  TempSrc:   PainSc: 2                  Vertell Row

## 2023-03-27 NOTE — Interval H&P Note (Signed)
 History and Physical Interval Note:  03/27/2023 11:00 AM  Margaret Farrell  has presented today for surgery, with the diagnosis of Endometriosis, Fibroids, Menorrhagia with irregular cycle, Dysmenorrhea, Urgency incontinence.  The various methods of treatment have been discussed with the patient and family. After consideration of risks, benefits and other options for treatment, the patient has consented to  Procedure(s): XI ROBOTIC ASSISTED LAPAROSCOPIC HYSTERECTOMY AND SALPINGECTOMY, (Bilateral) CYSTOSCOPY (N/A) possible peritoneal stripping, possible excision of endometriosis (N/A) as a surgical intervention.  The patient's history has been reviewed, patient examined, no change in status, stable for surgery.  I have reviewed the patient's chart and labs.  Questions were answered to the patient's satisfaction.     Almarie MARLA Carpen

## 2023-03-27 NOTE — Op Note (Signed)
 03/27/2023  [id number] Margaret Farrell        OPERATIVE REPORT   Preop Diagnosis: menorrhagia, dysmenorrhea, anemia, fibroids, endometriosis, pelvic pain.  Procedure: robotic hysterectomy, bilateral salpingectomy, cystoscopy, repair of vaginal laceration   Surgeon: Dr. Almarie Sauer Blanchie Zeleznik Assistant: Judyann Rattler, RN   Fluids: please see anesthesia report   Complications: None Anesthesia: General      Findings:  boggy contour 12cm fibroid uterus, normal ovaries and tubes, 11cm large funal pedunculated fibroid that had torsed the uterus and 5cm posterior pedunculated fibroid. 1st degree vaginal laceration from removal of the uterus vaginally was repaired at the end of the case with good hemostasis with 2.0 vicryl  Cystoscopy at the end of the case with normal bladder and patent ureters bilaterally. Stage 3-4 endometriosis with interesting note of large bowel with what appears like ink staining   Estimated blood loss: Minimal 150cc   Specimens: Uterus, fibroids, cervix and bilateral tubes   Disposition of specimen: Pathology           Patient is taken to the operating room. She is placed in the supine position. She is a running IV in place. Informed consent was present on the chart. SCDs on her lower extremities and functioning properly. Patient was positioned while she was awake.  Her legs were placed in the low lithotomy position in [place] stirrups. Her arms were tucked by the side.  General endotracheal anesthesia was administered by the anesthesia staff without difficulty.       Clora prep was then used to prep the abdomen and Hibiclens  was used to prep the inner thighs, perineum and vagina. Once 3 minutes had past the patient was draped in a normal standard fashion. A proper time out was performed and everyone agreed.  The legs were lifted to the high lithotomy position. A bivalve speculum was inserted into the vagina and the anterior lip of the cervix was grasped with  single-tooth tenaculum.  The uterus sounded to 12 cm. Pratt dilators were used to dilate the cervix.  The RUMI uterine manipulator was obtained inserted into the endometrial cavity and the bulb of the disposable tip was inflated with 8 cc of normal saline. There was a good fit of the KOH ring around the cervix. The tenaculum and bivavle speculum was removed. There is also good manipulation of the uterus.  A Foley catheter was placed to straight drain.  Clear urine was noted. Legs were lowered to the low lithotomy position and attention was turned the abdomen.   Superior to the umbilicus, marcaine  0.25% used to anesthetize the skin.  Using #11 blade, 8mm skin incision was made.  The 8mm robotic trocar and sleeve was inserted under direct visualization.  CO2 gas was  started and patient was placed in trendelenburg position.  Two additional 8mm ports were placed under direct visualization in the left and right lower quadrant.     Ureters were identifies. Large fundal fibroid and posterior fibroid removed for better visualization to complete the hysterectomy. These both were bagged and removed at the end of the case. Posterior peritoneum was removed with endometriosis lesions.  Attention was turned to the left side. The left tube was elevated and the mesosalpinx was desiccated with the vessel sealer.  The left uterine ovarian pedicle was serially clamped cauterized and incised. Left round ligament was serially clamped cauterized and incised. The anterior and posterior peritoneum of the inferior leaf of the broad ligament were opened. The beginning of the bladder flap was created.  The bladder was taken down below the level of the KOH ring. The left uterine artery skeletonized and then just superior to the KOH ring this vessel was serially clamped, cauterized, and incised.   Attention was turned the right side.  The uterus was placed on stretch to the opposite side.    The mesosalpinx was incised freeing the tube.  Then the right uterine ovarian pedicle was serially clamped cauterized and incised. Next the right round ligament was serially clamped cauterized and incised. The anterior posterior peritoneum of the inferiorly for the broad ligament were opened. The anterior peritoneum was carried across to the dissection on the left side. The remainder of the bladder flap was created using sharp dissection. The bladder was well below the level of the KOH ring. The right uterine artery skeletonized. Then the right uterine artery, above the level of the KOH ring, was serially clamped cauterized and incised. The uterus was devascularized at this point.   The colpotomy was performed.  This was carried around a circumferential fashion until the vaginal mucosa was completely incised in the specimen was freed.  The specimen was then delivered to the vagina intact.  A vaginal occlusive device was used to maintain the pneumoperitoneum   Instruments were changed with a needle driver and prograsp.  Using a 9 inch  zero V-lock suture, the cuff was closed by incorporating the anterior and posterior vaginal mucosa in each stitch. This was carried across all the way to the left corner and a running fashion. Two stitches were brought back towards the midline and the suture was cut flush with the vagina. The needle was brought out the pelvis. The pelvis was irrigated. All pedicles were inspected. No bleeding was noted.   Co2 pressures were lowered to 8mm Hg.  Again, no bleeding was noted.  Ureters were noted deep in the pelvis to be peristalsing.  At this point the procedure was completed.  The remaining instruments were removed.  The ports were removed under direct visualization of the laparoscope and the pneumoperitoneum was relieved.   The skin was then closed with subcuticular stitches of 3-0 Vicryl. The skin was cleansed Dermabond was applied. Attention was then turned the vagina and the cuff was inspected. 1st degree vaginal laceration  from removal of the uterus vaginally was repaired at the end of the case with good hemostasis with 2.0 vicryl.  No bleeding was noted.  The Foley catheter was removed.  Cystoscopy was performed.  No sutures or bladder injuries were noted.  Ureters were noted with normal urine jets from each one was seen.  Foley was left out after the cystoscopic fluid was drained and cystoscope removed.  Sponge, lap, needle, instrument counts were correct x2. Patient tolerated the procedure very well. She was awakened from anesthesia, extubated and taken to recovery in stable condition.      Dr. Glennon

## 2023-03-27 NOTE — Anesthesia Procedure Notes (Signed)
 Procedure Name: Intubation Date/Time: 03/27/2023 11:35 AM  Performed by: Pam Macario BROCKS, CRNAPre-anesthesia Checklist: Patient identified, Emergency Drugs available, Suction available, Patient being monitored and Timeout performed Patient Re-evaluated:Patient Re-evaluated prior to induction Oxygen Delivery Method: Circle system utilized Preoxygenation: Pre-oxygenation with 100% oxygen Induction Type: IV induction Ventilation: Mask ventilation without difficulty Laryngoscope Size: Mac and 3 Grade View: Grade II Tube type: Oral Tube size: 7.0 mm Number of attempts: 1 Airway Equipment and Method: Stylet and Oral airway Placement Confirmation: ETT inserted through vocal cords under direct vision, positive ETCO2, breath sounds checked- equal and bilateral and CO2 detector Secured at: 23 cm Tube secured with: Tape Dental Injury: Teeth and Oropharynx as per pre-operative assessment

## 2023-03-27 NOTE — Transfer of Care (Signed)
 Immediate Anesthesia Transfer of Care Note  Patient: Margaret Farrell  Procedure(s) Performed: Procedure(s) (LRB): XI ROBOTIC ASSISTED LAPAROSCOPIC HYSTERECTOMY AND SALPINGECTOMY, (Bilateral) CYSTOSCOPY (N/A) peritoneal stripping with endmotriosis lesion (N/A) SUTURE REPAIR PERINEAL LACERATION  Patient Location: PACU  Anesthesia Type: GA  Level of Consciousness: awake, sedated, patient cooperative and responds to stimulation, c/o pain in back - comfort measures given w/ medication   Airway & Oxygen Therapy: Patient Spontanous Breathing and Patient connected to [place] oxygen  Post-op Assessment: Report given to PACU RN, Post -op Vital signs reviewed and stable and Patient moving all extremities  Post vital signs: Reviewed and stable  Complications: No apparent anesthesia complications

## 2023-03-28 ENCOUNTER — Encounter (HOSPITAL_BASED_OUTPATIENT_CLINIC_OR_DEPARTMENT_OTHER): Payer: Self-pay | Admitting: Obstetrics and Gynecology

## 2023-03-29 LAB — SURGICAL PATHOLOGY

## 2023-03-31 ENCOUNTER — Encounter: Payer: Self-pay | Admitting: Obstetrics and Gynecology

## 2023-04-10 ENCOUNTER — Ambulatory Visit (INDEPENDENT_AMBULATORY_CARE_PROVIDER_SITE_OTHER): Payer: BC Managed Care – PPO | Admitting: Obstetrics and Gynecology

## 2023-04-10 ENCOUNTER — Encounter: Payer: Self-pay | Admitting: Obstetrics and Gynecology

## 2023-04-10 VITALS — BP 122/80 | Resp 16

## 2023-04-10 DIAGNOSIS — Z9889 Other specified postprocedural states: Secondary | ICD-10-CM

## 2023-04-10 NOTE — Progress Notes (Signed)
Patient presents for 2 week postop from [facility], bilateral salpingectomy, cystoscopy. She is doing well. No fevers, VB, dysuria or severe abdominal pain. She is glad she had the procedure.  BP 122/80   Resp 16   LMP 02/28/2023 (Exact Date)   SpO2 99%   Abdomen: incisions I/c/d, NT, ND    2 wk ago   SURGICAL PATHOLOGY SURGICAL PATHOLOGY CASE: [id number] PATIENT: Margaret Farrell Surgical Pathology Report     Clinical History: endometriosis, fibroids, menorrhagia, with irregular cycle, dysmenorrhea, urgency incontinence (tb)     FINAL MICROSCOPIC DIAGNOSIS:  A. UTERUS, CERVIX, BILATERAL FALLOPIAN TUBES: -  Unremarkable cervix, negative for dysplasia. -  Weakly proliferative to atrophic endometrium, negative for atypia/hyperplasia. -  Myometrium with benign leiomyomata. -  Fallopian tubes with endometriosis.  B. POSTERIOR, PERITONEUM, BIOPSY: -  Fibroadipose tissue with endosalpingosis/endometriosis.   GROSS DESCRIPTION:  A.  Hysterectomy with bilateral salpingectomy Specimen integrity: Predominantly intact; multiple, unoriented, disrupted soft tissue fragments Size and shape: 9.5 cm superior-inferior, 9.6 cm cornu-cornu, 5.0 cm anterior posterior; distorted but predominantly symmetrical shape Weight: 200.0 g (fresh, uterus with bilateral tubes) Serosa: Predominantly pink-tan and smooth with focal, roughened areas (consistent with surgical artifact). Cervix: 3.3 x 3.1 cm, 1.2 cm round os.  The ectocervix is markedly disrupted (predominantly anteriorly) and otherwise pink-tan without discrete lesions.  The endocervix is white-tan mildly trabeculated without discrete lesions. Endometrium: 4.0 cm superior inferior, 4.6 cm cornu-cornu, 0.2 cm thick; tan-pink and, plush, and predominantly smooth; distorted and without discrete lesions. Myometrium: Numerous white-tan, well-circumscribed nodules with whorled, bulging cut surfaces (0.8-4.5 cm in greatest dimension);  otherwise tan-pink and mildly trabeculated. Right adnexa: 10.1 cm long, tortuous, purple-gray, fimbriated fallopian tube with a 0.5-1.0 cm diameter; the cut surfaces are grossly unremarkable without discrete lesions.  The ovary surgically absent. Left adnexa (partially detached): 7.1 cm long, purple-gray, tortuous, fimbriated fallopian tube with a 0.5-0.7 cm diameter.  The cut surfaces are grossly unremarkable without discrete lesions.  The ovary surgically absent. Additional fragments: 16.3 x 15.2 x 7.0 cm, 407.0 g (fresh); white-tan, solid nodules with whorled cut surfaces.  There are focal areas of yellow discoloration; hemorrhage and calcifications are grossly absent. Block Summary:    A1: Anterior cervix    A2: Posterior cervix    A3: Anterior endomyometrium, f   A/p PO from [facility] 2 weeks doing well Encouraged no heavy lifting, pushing, pulling greater than 10 lbs for full 8 weeks 2. Pelvic rest for the entire 10 wks 3. RTC with any concerns or with heavy bleeding, fevers or severe abdominal pain.  Dr. Karma Greaser

## 2023-04-30 ENCOUNTER — Encounter: Payer: Self-pay | Admitting: Obstetrics and Gynecology

## 2023-05-27 ENCOUNTER — Encounter: Payer: Self-pay | Admitting: Neurology

## 2023-05-27 ENCOUNTER — Ambulatory Visit: Admitting: Neurology

## 2023-05-27 VITALS — BP 121/85 | HR 98 | Ht 64.0 in | Wt 213.4 lb

## 2023-05-27 DIAGNOSIS — E669 Obesity, unspecified: Secondary | ICD-10-CM

## 2023-05-27 DIAGNOSIS — Z9189 Other specified personal risk factors, not elsewhere classified: Secondary | ICD-10-CM | POA: Diagnosis not present

## 2023-05-27 DIAGNOSIS — Z820 Family history of epilepsy and other diseases of the nervous system: Secondary | ICD-10-CM

## 2023-05-27 DIAGNOSIS — R351 Nocturia: Secondary | ICD-10-CM

## 2023-05-27 DIAGNOSIS — R0683 Snoring: Secondary | ICD-10-CM

## 2023-05-27 NOTE — Progress Notes (Signed)
 Subjective:    Patient ID: Margaret Farrell is a 43 y.o. female.  HPI    Huston Foley, MD, PhD [facility] [address] [address] [place], [place] [address]  Dear Lillia Abed,  I saw your patient, Margaret Farrell, and your kind request in my sleep clinic today for initial consultation of her sleep disorder, in particular, concern for underlying obstructive sleep apnea.  The patient is unaccompanied today.  As you know, Margaret Farrell is a 43 year old female with underlying medical history of anemia, endometriosis, adrenal adenoma, hyperaldosteronism, and obesity, who reports snoring, which has been noted to be loud per mom.  Patient had a hysterectomy earlier this year and mom stayed with her for about a week and noticed that patient snores fairly loudly at times.  She has never had a sleep study.  She has no witnessed apneas and has not woken up with a sense of gasping for air. Her Epworth sleepiness score is 3 out of 24, fatigue severity score is 14 out of 63.  Her father has sleep apnea and uses a PAP machine.  I reviewed your office note from 04/16/2023.  Her bedtime is around 8:30 PM and 9:30 PM and rise time is around 6 AM.  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  She is single and lives alone with 2 dogs.  The dogs sleep in her room with her but not on the bed.  She has no TV in her bedroom.  She has been stable with her weight in the past 2 to 3 years but working on weight loss.  She does not smoke, she drinks alcohol every other day, 1 or 2 beers and 1 glass of wine typically.  She does not drink caffeine daily, occasional tea or soda.  She denies recurrent nocturnal morning headaches, she does have nocturia about once per average night when she lets the dogs out around midnight.  Her Past Medical History Is Significant For: Past Medical History:  Diagnosis Date   Adrenal adenoma, left 01/2022   incidental  finding on CT 01/ 2024 in epic ,  benign   Anemia    Dysmenorrhea    Endometriosis    Family history of ovarian cancer    sister   Fibroadenoma    right breat.  Repeat US q 6 months   Fibroid, uterine    Hyperaldosteronism, unspecified [facility])    endocrinologist--- dr Colen Darling;   note in epic 03-18-2023  incidental finding left benign adrenal gland adenoma per CT 12/ 2023  pt does need bp med or potassium replacement   Menorrhagia with irregular cycle    Wears glasses     Her Past Surgical History Is Significant For: Past Surgical History:  Procedure Laterality Date   CHOLECYSTECTOMY N/A 03/20/2022   Procedure: LAPAROSCOPIC CHOLECYSTECTOMY;  Surgeon: Berna Bue, MD;  Location: [facility];  Service: General;  Laterality: N/A;   CRANIECTOMY FOR CRANIOSYNOSTOSIS  1984   CYSTOSCOPY N/A 03/27/2023   Procedure: CYSTOSCOPY;  Surgeon: Earley Favor, MD;  Location: [facility];  Service: Gynecology;  Laterality: N/A;   GALLBLADDER SURGERY  02/2022   LAPAROSCOPIC OVARIAN CYSTECTOMY Right 2013   in [place] found endometriosis   OVARIAN CYST REMOVAL N/A 03/27/2023   Procedure: peritoneal stripping with endmotriosis lesion;  Surgeon: Earley Favor, MD;  Location: [facility];  Service: Gynecology;  Laterality: N/A;  PERINEAL LACERATION REPAIR  03/27/2023   Procedure: SUTURE REPAIR PERINEAL LACERATION;  Surgeon: Earley Favor, MD;  Location: [facility];  Service: Gynecology;;   ROBOTIC ASSISTED LAPAROSCOPIC HYSTERECTOMY AND SALPINGECTOMY Bilateral 03/27/2023   Procedure: XI ROBOTIC ASSISTED LAPAROSCOPIC HYSTERECTOMY AND SALPINGECTOMY,;  Surgeon: Earley Favor, MD;  Location: [facility];  Service: Gynecology;  Laterality: Bilateral;    Her Family History Is Significant For: Family History  Problem Relation Age of Onset   Diabetes Father    Hypertension Father    Ovarian cancer Sister    Diabetes  Paternal Uncle    Thyroid cancer Paternal Uncle    Colon cancer Maternal Grandmother    Skin cancer Maternal Grandfather    Diabetes Paternal Grandmother    Hypertension Paternal Grandmother     Her Social History Is Significant For: Social History   Socioeconomic History   Marital status: Single    Spouse name: Not on file   Number of children: Not on file   Years of education: Not on file   Highest education level: Not on file  Occupational History   Not on file  Tobacco Use   Smoking status: Never   Smokeless tobacco: Never  Vaping Use   Vaping status: Never Used  Substance and Sexual Activity   Alcohol use: Yes    Comment: occasional   Drug use: Never   Sexual activity: Not Currently    Birth control/protection: Surgical    Comment: no preference, never sexually active, hysterectomy  Other Topics Concern   Not on file  Social History Narrative   Not on file   Social Drivers of Health   Financial Resource Strain: Not on file  Food Insecurity: Not on file  Transportation Needs: Not on file  Physical Activity: Not on file  Stress: Not on file  Social Connections: Not on file    Her Allergies Are:  Allergies  Allergen Reactions   Hibiclens [Chlorhexidine Gluconate]     redness   Sulfa Antibiotics Hives, Itching and Rash  :   Her Current Medications Are:  Outpatient Encounter Medications as of 05/27/2023  Medication Sig   cetirizine (ZYRTEC) 10 MG tablet Take 10 mg by mouth at bedtime.   Probiotic Product (PROBIOTIC PO) Take 2 capsules by mouth at bedtime.   Acetaminophen (TYLENOL PO) Take by mouth.   B Complex Vitamins (B COMPLEX PO) Take 1 capsule by mouth at bedtime. (Patient not taking: Reported on 05/27/2023)   Cholecalciferol (VITAMIN D-3) 25 MCG (1000 UT) CAPS Take 1 capsule by mouth at bedtime. (Patient not taking: Reported on 05/27/2023)   ECHINACEA-GOLDENSEAL PO Take 1,300 mg by mouth as needed. (Patient not taking: Reported on 05/27/2023)   ibuprofen  (ADVIL) 800 MG tablet Take 1 tablet (800 mg total) by mouth every 8 (eight) hours as needed.   Magnesium 200 MG TABS Take 2 tablets by mouth at bedtime. (Patient not taking: Reported on 05/27/2023)   Multiple Vitamin (MULTIVITAMIN PO) Take 1 capsule by mouth at bedtime. (Patient not taking: Reported on 05/27/2023)   Vitamin D-Vitamin K (D3 + K2 DOTS) 1000-90 UNIT-MCG TABS Take 1 capsule by mouth at bedtime. (Patient not taking: Reported on 05/27/2023)   Zinc 50 MG TABS Take 1 tablet by mouth at bedtime. (Patient not taking: Reported on 05/27/2023)   No facility-administered encounter medications on file as of 05/27/2023.  :   Review of Systems:  Out of a complete 14 point review of systems, all are  reviewed and negative with the exception of these symptoms as listed below:  Review of Systems  Neurological:        Room 9 Pt is here Alone Pt states that she had a procedure done and her mother stayed the night with her and told her the next morning that she snores. Pt has a history of Sleep Apnea in her family (Father).      Objective:  Neurological Exam  Physical Exam Physical Examination:   Vitals:   05/27/23 0846  BP: 121/85  Pulse: 98    General Examination: The patient is a very pleasant 43 y.o. female in no acute distress. She appears well-developed and well-nourished and well groomed.   HEENT: Normocephalic, atraumatic, pupils are equal, round and reactive to light, extraocular tracking is good with right eye dominance noted and left eyes significant exotropia.  She can also focus with her left eye and has exotropia of the right eye but primarily uses her right eye to focus.  Hearing is grossly intact. Face is symmetric with normal facial animation. Speech is clear with no dysarthria noted. There is no hypophonia. There is no lip, neck/head, jaw or voice tremor. Neck is supple with full range of passive and active motion. There are no carotid bruits on auscultation. Oropharynx exam  reveals: mild mouth dryness, good dental hygiene and moderate airway crowding, due to small airway entry, Mallampati class IV, tonsillar size about 1+, neck circumference 15 inches, no significant overbite, slight crossbite noted.  Tongue protrudes centrally and palate elevates symmetrically.  Chest: Clear to auscultation without wheezing, rhonchi or crackles noted.  Heart: S1+S2+0, regular and normal without murmurs, rubs or gallops noted.   Abdomen: Soft, non-tender and non-distended.  Extremities: There is no pitting edema in the distal lower extremities bilaterally.   Skin: Warm and dry without trophic changes noted.   Musculoskeletal: exam reveals no obvious joint deformities.   Neurologically:  Mental status: The patient is awake, alert and oriented in all 4 spheres. Her immediate and remote memory, attention, language skills and fund of knowledge are appropriate. There is no evidence of aphasia, agnosia, apraxia or anomia. Speech is clear with normal prosody and enunciation. Thought process is linear. Mood is normal and affect is normal.  Cranial nerves II - XII are as described above under HEENT exam.  Motor exam: Normal bulk, strength and tone is noted. There is no obvious action or resting tremor.  Fine motor skills and coordination: grossly intact.  Cerebellar testing: No dysmetria or intention tremor. There is no truncal or gait ataxia.  Sensory exam: intact to light touch in the upper and lower extremities.  Gait, station and balance: She stands easily. No veering to one side is noted. No leaning to one side is noted. Posture is age-appropriate and stance is narrow based. Gait shows normal stride length and normal pace. No problems turning are noted.   Assessment and Plan:   In summary, Margaret Farrell is a very pleasant 43 year old female with underlying medical history of anemia, endometriosis, adrenal adenoma, hyperaldosteronism, and obesity, whose history and physical exam are  concerning for sleep disordered breathing, particularly obstructive sleep apnea (OSA). A laboratory attended sleep study is typically considered "gold standard" for evaluation of sleep disordered breathing.   I had a long chat with the patient about my findings and the diagnosis of sleep apnea, particularly OSA, its prognosis and treatment options. We talked about medical/conservative treatments, surgical interventions and non-pharmacological approaches for symptom control. I explained,  in particular, the risks and ramifications of untreated moderate to severe OSA, especially with respect to developing cardiovascular disease down the road, including congestive heart failure (CHF), difficult to treat hypertension, cardiac arrhythmias (particularly A-fib), neurovascular complications including TIA, stroke and dementia. Even type 2 diabetes has, in part, been linked to untreated OSA. Symptoms of untreated OSA may include (but may not be limited to) daytime sleepiness, nocturia (i.e. frequent nighttime urination), memory problems, mood irritability and suboptimally controlled or worsening mood disorder such as depression and/or anxiety, lack of energy, lack of motivation, physical discomfort, as well as recurrent headaches, especially morning or nocturnal headaches. We talked about the importance of maintaining a healthy lifestyle and striving for healthy weight.  In addition, we talked about the importance of striving for and maintaining good sleep hygiene. I recommended a sleep study at this time. I outlined the differences between a laboratory attended sleep study which is considered more comprehensive and accurate over the option of a home sleep test (HST); the latter may lead to underestimation of sleep disordered breathing in some instances and does not help with diagnosing upper airway resistance syndrome and is not accurate enough to diagnose primary central sleep apnea typically. I outlined possible surgical  and non-surgical treatment options of OSA, including the use of a positive airway pressure (PAP) device (i.e. CPAP, AutoPAP/APAP or BiPAP in certain circumstances), a custom-made dental device (aka oral appliance, which would require a referral to a specialist dentist or orthodontist typically, and is generally speaking not considered for patients with full dentures or edentulous state), upper airway surgical options, such as traditional UPPP (which is not considered a first-line treatment) or the Inspire device (hypoglossal nerve stimulator, which would involve a referral for consultation with an ENT surgeon, after careful selection, following inclusion criteria - also not first-line treatment). I explained the PAP treatment option to the patient in detail, as this is generally considered first-line treatment.  The patient indicated that she would be willing to try PAP therapy, if the need arises. I explained the importance of being compliant with PAP treatment, not only for insurance purposes but primarily to improve patient's symptoms symptoms, and for the patient's long term health benefit, including to reduce Her cardiovascular risks longer-term.    We will pick up our discussion about the next steps and treatment options after testing.  We will keep her posted as to the test results by phone call and/or MyChart messaging where possible.  We will plan to follow-up in sleep clinic accordingly as well.  I answered all her questions today and the patient was in agreement.   I encouraged her to call with any interim questions, concerns, problems or updates or email [place] through MyChart.  Generally speaking, sleep test authorizations may take up to 2 weeks, sometimes less, sometimes longer, the patient is encouraged to get in touch with [place] if they do not hear back from the sleep lab staff directly within the next 2 weeks.  Thank you very much for allowing me to participate in the care of this nice patient. If I  can be of any further assistance to you please do not hesitate to call me at [phone].  Sincerely,   Huston Foley, MD, PhD

## 2023-05-27 NOTE — Patient Instructions (Signed)

## 2023-05-30 ENCOUNTER — Telehealth: Payer: Self-pay | Admitting: Neurology

## 2023-05-30 NOTE — Telephone Encounter (Signed)
 [place] [employer] [place]

## 2023-05-31 ENCOUNTER — Encounter: Payer: Self-pay | Admitting: Obstetrics and Gynecology

## 2023-05-31 ENCOUNTER — Ambulatory Visit: Payer: BC Managed Care – PPO | Admitting: Obstetrics and Gynecology

## 2023-05-31 VITALS — BP 110/70 | HR 80 | Resp 16

## 2023-05-31 DIAGNOSIS — Z09 Encounter for follow-up examination after completed treatment for conditions other than malignant neoplasm: Secondary | ICD-10-CM

## 2023-05-31 NOTE — Progress Notes (Signed)
 Patient presents for 10 week postop from [facility], bilateral salpingectomy, cystoscopy. She is doing well. No fevers, VB, dysuria or severe abdominal pain. She is glad she had the procedure and happy with the outcome and feels great  BP 110/70   Pulse 80   Resp 16   LMP 02/28/2023 (Exact Date)   SVE: sutures dissolved. Normal discharge. Cuff intact and nontender to qtip palpation  A/p PO from [facility] 10 weeks doing well Resume all activities 2. RTC with any concerns. 3. Encouraged annual mammograms and resume annual care with PCP  Dr. Karma Greaser

## 2023-06-04 ENCOUNTER — Ambulatory Visit
Admission: RE | Admit: 2023-06-04 | Discharge: 2023-06-04 | Disposition: A | Discharge status: Home / Self Care | Payer: BC Managed Care – PPO | Source: Ambulatory Visit | Attending: Obstetrics and Gynecology

## 2023-06-04 ENCOUNTER — Other Ambulatory Visit: Payer: Self-pay | Admitting: Obstetrics and Gynecology

## 2023-06-04 DIAGNOSIS — N6310 Unspecified lump in the right breast, unspecified quadrant: Secondary | ICD-10-CM

## 2023-06-10 NOTE — Telephone Encounter (Signed)
 Spoke with the patient.   [employer] [place] no [place] req via fax   She is scheduled  at [facility] for 07/22/23 at 9 pm.    Mailed packet and sent mychart.

## 2023-06-10 NOTE — Telephone Encounter (Signed)
 [employer] [employer] [place] no [place] req via fax

## 2023-06-18 NOTE — Telephone Encounter (Signed)
 Patient called to r/s her SS.  She is r/s for 07/28/23 at 9 pm.  Mailed new packet and sent mychart.

## 2023-06-26 ENCOUNTER — Other Ambulatory Visit: Payer: Self-pay

## 2023-06-26 ENCOUNTER — Other Ambulatory Visit: Payer: Self-pay | Admitting: "Endocrinology

## 2023-06-26 DIAGNOSIS — E269 Hyperaldosteronism, unspecified: Secondary | ICD-10-CM

## 2023-06-26 DIAGNOSIS — D3502 Benign neoplasm of left adrenal gland: Secondary | ICD-10-CM

## 2023-07-04 ENCOUNTER — Encounter: Payer: Self-pay | Admitting: Obstetrics and Gynecology

## 2023-07-08 ENCOUNTER — Other Ambulatory Visit: Payer: BC Managed Care – PPO

## 2023-07-09 ENCOUNTER — Other Ambulatory Visit

## 2023-07-14 LAB — ALDOSTERONE + RENIN ACTIVITY W/ RATIO
ALDO / PRA Ratio: 19.4 ratio (ref 0.9–28.9)
Aldosterone: 13 ng/dL
Renin Activity: 0.67 ng/mL/h (ref 0.25–5.82)

## 2023-07-14 LAB — BASIC METABOLIC PANEL WITH GFR
BUN: 13 mg/dL (ref 7–25)
CO2: 29 mmol/L (ref 20–32)
Calcium: 9.8 mg/dL (ref 8.6–10.2)
Chloride: 104 mmol/L (ref 98–110)
Creat: 0.76 mg/dL (ref 0.50–0.99)
Glucose, Bld: 93 mg/dL (ref 65–139)
Potassium: 4.6 mmol/L (ref 3.5–5.3)
Sodium: 140 mmol/L (ref 135–146)
eGFR: 101 mL/min/{1.73_m2} (ref >=60)

## 2023-07-16 ENCOUNTER — Ambulatory Visit: Payer: BC Managed Care – PPO | Admitting: "Endocrinology

## 2023-07-16 ENCOUNTER — Encounter: Payer: Self-pay | Admitting: "Endocrinology

## 2023-07-16 VITALS — BP 118/80 | HR 90 | Ht 64.0 in | Wt 212.0 lb

## 2023-07-16 DIAGNOSIS — D3502 Benign neoplasm of left adrenal gland: Secondary | ICD-10-CM

## 2023-07-16 NOTE — Progress Notes (Signed)
 Outpatient Endocrinology Note Margaret Newcomer, MD    Margaret Farrell Jun 15, 1980 [id number]  Referring Provider: Barnetta Liberty, MD Primary Care Provider: Barnetta Liberty, MD Reason for consultation: Subjective   Assessment & Plan  Diagnoses and all orders for this visit:  Adenoma of left adrenal gland -     DHEA-sulfate -     ACTH  -     Cortisol -     Metanephrines, plasma -     Aldosterone + renin activity w/ ratio    Incidental left adrenal adenoma found on CT abdomen pelvis with contrast done in 01/2022 in the ER 01/2022 CT scan Abs/Pel with contrast reported 13 mm nodule in the left adrenal gland 01/2023 CT abd W/WO reported enhancing 13 mm left adrenal nodule is consistent with a benign adrenal adenoma. No further imaging follow-up is required. Patient is otherwise asymptomatic  Ordered baseline adrenal labs: labs WNL except aldosterone elevated at 43 with elevated renin at 7.6-no intake of spironolactone/ACE/ARB Ordered repeat lab: Aldosterone came back again high at 41, with renin at 3 Ordered 3-day salt loading test to assess for primary hyperaldosteronism: aldosterone low at 3 with urine sodium came back inadequate at 115 07/09/22: Aldo down to 13 from (40s) on its own, after hysterectomy, no recorded high Bps at home/here in clinic Continue annual adrenal lab monitoring   Return in about 6 months (around 01/16/2024) for visit and 8 am labs before next visit.   I have reviewed current medications, nurse's notes, allergies, vital signs, past medical and surgical history, family medical history, and social history for this encounter. Counseled patient on symptoms, examination findings, lab findings, imaging results, treatment decisions and monitoring and prognosis. The patient understood the recommendations and agrees with the treatment plan. All questions regarding treatment plan were fully answered.  Margaret Newcomer, MD  07/16/23   History of Present  Illness HPI  Margaret Farrell is a 43 y.o. year old female who presents for follow up of left adrenal incidentaloma.  Feel well S/p hysterectomy on 03/26/22 at [facility] long, history of fibroids and endometriosis, no blood pressure issues in surgery Checks BP at times, averages systolic 121-122 and diastolic in 90s, usually 80-85  Not on any BP med or any potassium replacement    Initial history:   Patient was found to have incidental left adrenal adenoma in CT scan Abs/Pel with contrast done in 01/2022 (Adrenals/Urinary Tract: 13 mm nodule in the left adrenal gland).  No active complaints Patient has history of fibroids, endometriosis and plans for hysterectomy in 03/2023  She weight change No moon face No fat pads Yes +dorsocervical  increased girth Yes plethora No hyperpigmentation No purple striae No proximal muscle weakness No acne No vellus/terminal hirsutism No scalp hair loss No a history of HTN a history of hypokalemia No  paroxysmal episodes of anxiety No tremors No lightheadedness No headache No palpitation No sweating Yes blurry vision No  Physical Exam  BP 118/80   Pulse 90   Ht 5\' 4"  (1.626 m)   Wt 212 lb (96.2 kg)   LMP 02/28/2023 (Exact Date)   SpO2 98%   BMI 36.39 kg/m    Constitutional: well developed, well nourished Head: normocephalic, atraumatic Eyes: sclera anicteric, no redness Neck: supple Lungs: normal respiratory effort Neurology: alert and oriented Skin: dry, no appreciable rashes Musculoskeletal: no appreciable defects Psychiatric: normal mood and affect   Current Medications Patient's Medications  New Prescriptions   No medications on file  Previous Medications  B COMPLEX VITAMINS (B COMPLEX PO)    Take 1 capsule by mouth at bedtime.   CETIRIZINE (ZYRTEC) 10 MG TABLET    Take 10 mg by mouth at bedtime.   CHOLECALCIFEROL (VITAMIN D-3) 25 MCG (1000 UT) CAPS    Take 1 capsule by mouth at bedtime.   ECHINACEA-GOLDENSEAL PO    Take  1,300 mg by mouth as needed.   MAGNESIUM 200 MG TABS    Take 2 tablets by mouth at bedtime.   MULTIPLE VITAMIN (MULTIVITAMIN PO)    Take 1 capsule by mouth at bedtime.   PROBIOTIC PRODUCT (PROBIOTIC PO)    Take 2 capsules by mouth at bedtime.   VITAMIN D-VITAMIN K (D3 + K2 DOTS) 1000-90 UNIT-MCG TABS    Take 1 capsule by mouth at bedtime.   ZINC  50 MG TABS    Take 1 tablet by mouth at bedtime.  Modified Medications   No medications on file  Discontinued Medications   No medications on file    Allergies Allergies  Allergen Reactions   Hibiclens  [Chlorhexidine  Gluconate]     redness   Sulfa Antibiotics Hives, Itching and Rash    Past Medical History Past Medical History:  Diagnosis Date   Adrenal adenoma, left 01/2022   incidental finding on CT 01/ 2024 in epic ,  benign   Anemia    Dysmenorrhea    Endometriosis    Family history of ovarian cancer    sister   Fibroadenoma    right breat.  Repeat US  q 6 months   Fibroid, uterine    Hyperaldosteronism, unspecified [facility])    endocrinologist--- dr Gladstone Lamer;   note in epic 03-18-2023  incidental finding left benign adrenal gland adenoma per CT 12/ 2023  pt does need bp med or potassium replacement   Menorrhagia with irregular cycle    Wears glasses     Past Surgical History Past Surgical History:  Procedure Laterality Date   CHOLECYSTECTOMY N/A 03/20/2022   Procedure: LAPAROSCOPIC CHOLECYSTECTOMY;  Surgeon: Adalberto Acton, MD;  Location: [facility];  Service: General;  Laterality: N/A;   CRANIECTOMY FOR CRANIOSYNOSTOSIS  1984   CYSTOSCOPY N/A 03/27/2023   Procedure: CYSTOSCOPY;  Surgeon: Reinaldo Caras, MD;  Location: [facility];  Service: Gynecology;  Laterality: N/A;   GALLBLADDER SURGERY  02/2022   LAPAROSCOPIC OVARIAN CYSTECTOMY Right 2013   in [place] found endometriosis   OVARIAN CYST REMOVAL N/A 03/27/2023   Procedure: peritoneal stripping with endmotriosis lesion;  Surgeon: Reinaldo Caras,  MD;  Location: [facility];  Service: Gynecology;  Laterality: N/A;   PERINEAL LACERATION REPAIR  03/27/2023   Procedure: SUTURE REPAIR PERINEAL LACERATION;  Surgeon: Reinaldo Caras, MD;  Location[facility];  Service: Gynecology;;   ROBOTIC ASSISTED LAPAROSCOPIC HYSTERECTOMY AND SALPINGECTOMY Bilateral 03/27/2023   Procedure: XI ROBOTIC ASSISTED LAPAROSCOPIC HYSTERECTOMY AND SALPINGECTOMY,;  Surgeon: Reinaldo Caras, MD;  Location: [facility];  Service: Gynecology;  Laterality: Bilateral;    Family History family history includes Colon cancer in her maternal grandmother; Diabetes in her father, paternal grandmother, and paternal uncle; Hypertension in her father and paternal grandmother; Ovarian cancer in her sister; Skin cancer in her maternal grandfather; Thyroid cancer in her paternal uncle.  Social History Social History   Socioeconomic History   Marital status: Single    Spouse name: Not on file   Number of children: Not on file   Years of education: Not on file  Highest education level: Not on file  Occupational History   Not on file  Tobacco Use   Smoking status: Never   Smokeless tobacco: Never  Vaping Use   Vaping status: Never Used  Substance and Sexual Activity   Alcohol use: Yes    Comment: occasional   Drug use: Never   Sexual activity: Not Currently    Birth control/protection: Surgical    Comment: no preference, never sexually active, hysterectomy  Other Topics Concern   Not on file  Social History Narrative   Not on file   Social Drivers of Health   Financial Resource Strain: Not on file  Food Insecurity: Not on file  Transportation Needs: Not on file  Physical Activity: Not on file  Stress: Not on file  Social Connections: Not on file  Intimate Partner Violence: Not on file    No results found for: "CHOL" No results found for: "HDL" No results found for: "LDLCALC" No results found for:  "TRIG" No results found for: "CHOLHDL" Lab Results  Component Value Date   CREATININE 0.76 07/09/2023   No results found for: "GFR"    Component Value Date/Time   NA 140 07/09/2023 0814   K 4.6 07/09/2023 0814   CL 104 07/09/2023 0814   CO2 29 07/09/2023 0814   GLUCOSE 93 07/09/2023 0814   BUN 13 07/09/2023 0814   CREATININE 0.76 07/09/2023 0814   CALCIUM 9.8 07/09/2023 0814   PROT 7.6 02/01/2022 0340   ALBUMIN 3.9 02/01/2022 0340   AST 81 (H) 02/01/2022 0340   ALT 53 (H) 02/01/2022 0340   ALKPHOS 55 02/01/2022 0340   BILITOT 0.8 02/01/2022 0340   GFRNONAA >60 03/25/2023 0901      Latest Ref Rng & Units 07/09/2023    8:14 AM 03/25/2023    9:01 AM 03/14/2023    8:04 AM  BMP  Glucose 65 - 139 mg/dL 93  409  96   BUN 7 - 25 mg/dL 13  11  10    Creatinine 0.50 - 0.99 mg/dL 8.11  9.14  7.82   BUN/Creat Ratio 6 - 22 (calc) SEE NOTE:   SEE NOTE:   Sodium 135 - 146 mmol/L 140  136  140   Potassium 3.5 - 5.3 mmol/L 4.6  4.6  4.5   Chloride 98 - 110 mmol/L 104  106  107   CO2 20 - 32 mmol/L 29  23  25    Calcium 8.6 - 10.2 mg/dL 9.8  8.8  9.1        Component Value Date/Time   WBC 5.3 03/25/2023 0901   RBC 5.09 03/25/2023 0901   HGB 14.9 03/25/2023 0901   HCT 45.2 03/25/2023 0901   PLT 235 03/25/2023 0901   MCV 88.8 03/25/2023 0901   MCH 29.3 03/25/2023 0901   MCHC 33.0 03/25/2023 0901   RDW 13.3 03/25/2023 0901   LYMPHSABS 1.9 02/01/2022 0340   MONOABS 0.8 02/01/2022 0340   EOSABS 0.1 02/01/2022 0340   BASOSABS 0.1 02/01/2022 0340   No results found for: "TSH", "FREET4"       Parts of this note may have been dictated using voice recognition software. There may be variances in spelling and vocabulary which are unintentional. Not all errors are proofread. Please notify the [facility] if any discrepancies are noted or if the meaning of any statement is not clear.

## 2023-07-17 ENCOUNTER — Ambulatory Visit: Admitting: Obstetrics and Gynecology

## 2023-07-17 ENCOUNTER — Encounter: Payer: Self-pay | Admitting: Obstetrics and Gynecology

## 2023-07-17 VITALS — BP 112/76 | HR 98

## 2023-07-17 DIAGNOSIS — Z09 Encounter for follow-up examination after completed treatment for conditions other than malignant neoplasm: Secondary | ICD-10-CM

## 2023-07-18 NOTE — Progress Notes (Signed)
 Patient would like to check abdominal incisions from [facility]. Doing well otherwise, but feels they may have changed No VB, fevers or discharge from them.  Blood pressure 112/76, pulse 98, last menstrual period 02/28/2023, SpO2 99%.  Incisions I/c/d and healed. Superficially may have more skin but not forming keloids   A/p  Patient reassured as normal process of healing, but may use vit E oil on them for improved scarring. 2. RTC with any concerns and with annual exams   Dr. Tia Flowers

## 2023-07-22 ENCOUNTER — Encounter

## 2023-07-28 ENCOUNTER — Encounter

## 2023-08-14 ENCOUNTER — Encounter: Payer: Self-pay | Admitting: Neurology

## 2023-08-15 NOTE — Telephone Encounter (Signed)
 Patient called to r/s her SS.  She is r/s for 09/15/23 at 9 pm.

## 2023-08-16 ENCOUNTER — Encounter

## 2023-09-15 ENCOUNTER — Ambulatory Visit (INDEPENDENT_AMBULATORY_CARE_PROVIDER_SITE_OTHER): Admitting: Neurology

## 2023-09-15 DIAGNOSIS — R0683 Snoring: Secondary | ICD-10-CM | POA: Diagnosis not present

## 2023-09-15 DIAGNOSIS — G4720 Circadian rhythm sleep disorder, unspecified type: Secondary | ICD-10-CM

## 2023-09-15 DIAGNOSIS — Z820 Family history of epilepsy and other diseases of the nervous system: Secondary | ICD-10-CM

## 2023-09-15 DIAGNOSIS — R351 Nocturia: Secondary | ICD-10-CM

## 2023-09-15 DIAGNOSIS — Z9189 Other specified personal risk factors, not elsewhere classified: Secondary | ICD-10-CM

## 2023-09-15 DIAGNOSIS — G4733 Obstructive sleep apnea (adult) (pediatric): Secondary | ICD-10-CM

## 2023-09-15 DIAGNOSIS — E669 Obesity, unspecified: Secondary | ICD-10-CM

## 2023-09-17 ENCOUNTER — Ambulatory Visit: Payer: Self-pay | Admitting: Neurology

## 2023-09-17 DIAGNOSIS — G4733 Obstructive sleep apnea (adult) (pediatric): Secondary | ICD-10-CM

## 2023-09-17 NOTE — Procedures (Signed)
 Physician Interpretation:     [facility] POLYSOMNOGRAPHY  INTERPRETATION REPORT   STUDY DATE:  09/15/2023     PATIENT NAME:  Margaret Farrell         DATE OF BIRTH:  02/17/1981  PATIENT ID:  [id number]    TYPE OF STUDY:  PSG  READING PHYSICIAN: TRUE MAR, MD, PhD   SCORING TECHNICIAN: Jesusa Haddock, RPSGT   Referred by: Manuelita Ruth, NP ? History and Indication for Testing: 43 year old female with underlying medical history of anemia, endometriosis, adrenal adenoma, hyperaldosteronism, and obesity, who reports loud snoring and a family history of sleep apnea. Her Epworth sleepiness score is 3 out of 24, fatigue severity score is 14 out of 63.  Height: 64 in Weight: 213 lb (BMI 36) Neck Size: 15 in    MEDICATIONS: Zyrtec, Probiotic, Tylenol , Advil    TECHNICAL DESCRIPTION: A registered sleep technologist was in attendance for the duration of the recording.  Data collection, scoring, video monitoring, and reporting were performed in compliance with the AASM Manual for the Scoring of Sleep and Associated Events; (Hypopnea is scored based on the criteria listed in Section VIII D. 1b in the AASM Manual V2.6 using a 4% oxygen desaturation rule or Hypopnea is scored based on the criteria listed in Section VIII D. 1a in the AASM Manual V2.6 using 3% oxygen desaturation and /or arousal rule).   SLEEP CONTINUITY AND SLEEP ARCHITECTURE:  Lights-out was at 21:50: and lights-on at  05:08:, with a total recording time of 7 hours, 18 min. Total sleep time ( TST) was 409.0 minutes with a normal sleep efficiency at 93.4%. There was  18.2% REM sleep.   BODY POSITION:  TST was divided  between the following sleep positions: 100.0% supine;  0.0% lateral;  0% prone. Duration of total sleep and percent of total sleep in their respective position is as follows: supine 409 minutes (100%), non-supine 0 minutes (0%); right 00 minutes (0%), left 00 minutes (0%), and prone 00 minutes  (0%).  Total supine REM sleep time was 74 minutes (100% of total REM sleep).  Sleep latency was normal at 17.5 minutes.  REM sleep latency was mildly below normal at 65.5 minutes. Of the total sleep time, the percentage of stage N1 sleep was 2.4%, stage N2 sleep was 61%, which is increased, stage N3 sleep was 18.8%, which is normal, and REM sleep was 18.2%, which is near-normal. Wake after sleep onset (WASO) time accounted for 11.5 minutes with minimal sleep fragmentation noted.   RESPIRATORY MONITORING:   Based on CMS criteria (using a 4% oxygen desaturation rule for scoring hypopneas), there were 6 apneas (6 obstructive; 0 central; 0 mixed), and 28 hypopneas.  Apnea index was 0.9. Hypopnea index was 4.1. The apnea-hypopnea index was 5.0 overall (5.0 supine, 0 non-supine; 20.9 REM, 20.9 supine REM).  There were 0 respiratory effort-related arousals (RERAs).  The RERA index was 0 events/h. Total respiratory disturbance index (RDI) was 5.0 events/h. RDI results showed: supine RDI  5.0 /h; non-supine RDI 0.0 /h; REM RDI 20.9 /h, supine REM RDI 20.9 /h.   Based on AASM criteria (using a 3% oxygen desaturation and /or arousal rule for scoring hypopneas), there were 6 apneas (6 obstructive; 0 central; 0 mixed), and 64 hypopneas. Apnea index was 0.9. Hypopnea index was 9.4. The apnea-hypopnea index was 10.3/hour overall (10.3 supine, 0 non-supine; 32.2 REM, 32.2 supine REM).  There were 0 respiratory effort-related arousals (RERAs).  The RERA index was 0  events/h. Total respiratory disturbance index (RDI) was 10.3 events/h. RDI results showed: supine RDI  10.3 /h; non-supine RDI 0.0 /h; REM RDI 32.2 /h, supine REM RDI 32.2 /h.    OXIMETRY: Oxyhemoglobin Saturation Nadir during sleep was at  88% from a mean of 94%.  Of the Total sleep time (TST)   hypoxemia (=<88%) was present for  0.3 minutes, or 0.1% of total sleep time.    LIMB MOVEMENTS: There were 0 periodic limb movements of sleep (0.0/hr), of which 0  (0.0/hr) were associated with an arousal.   AROUSAL: There were 105 arousals in total, for an arousal index of 15 arousals/hour.  Of these, 26 were identified as respiratory-related arousals (4 /h), 0 were PLM-related arousals (0 /h), and 87 were non-specific arousals (13 /h).    EEG: Review of the EEG showed no abnormal electrical discharges and symmetrical bihemispheric findings.      EKG: The EKG revealed normal sinus rhythm (NSR). The average heart rate during sleep was 84 bpm.     AUDIO/VIDEO REVIEW: The audio and video review did not show any abnormal or unusual behaviors, movements, phonations or vocalizations. The patient took no restroom breaks. Snoring was noted, ranging from mild to louder.    POST-STUDY QUESTIONNAIRE: Post study, the patient indicated, that sleep was worse than usual.     IMPRESSION:    1. Mild obstructive Sleep Apnea (OSA) 2. Dysfunctions associated with sleep stages or arousal from sleep  RECOMMENDATIONS:    1. This study demonstrates overall mild obstructive sleep apnea, more pronounced during REM sleep with a total AHI of 10.3/h, REM AHI of 32.2/h, supine AHI of 10.3/h, O2 nadir 88%.  Variable snoring was detected.  Given the patient's medical history and sleep related complaints, treatment with positive airway pressure is recommended; this can be achieved in the form of autoPAP.  Concomitant weight loss is recommended.  A full-night CPAP titration study would allow optimization of therapy if needed, down the road. Other treatment options may include avoidance of supine sleep position along with weight loss (where clinically feasible and applicable), or the use of an oral appliance in selected patients.  Generally speaking, surgical treatment options are not recommended for mild obstructive sleep apnea. Please note, that untreated obstructive sleep apnea may carry additional perioperative morbidity. Patients with significant obstructive sleep apnea should  receive perioperative PAP therapy and the surgeons and particularly the anesthesiologist should be informed of the diagnosis and the severity of the sleep disordered breathing. 2. This study shows some sleep fragmentation and mildly abnormal sleep stage percentages; these are nonspecific findings and per se do not signify an intrinsic sleep disorder or a cause for the patient's sleep-related symptoms. Causes include (but are not limited to) the first night effect of the sleep study, circadian rhythm disturbances, medication effect or an underlying mood disorder or medical problem.  3. The patient should be cautioned not to drive, work at heights, or operate dangerous or heavy equipment when tired or sleepy. Review and reiteration of good sleep hygiene measures should be pursued with any patient. 4. The patient will be seen in follow-up in the Sleep Clinic at [facility] for discussion of the test results, progress with treatment, and recheck on symptoms as well as potential further management strategies. The referring provider and the patient will be notified of the test results.    I certify that I have reviewed the entire raw data recording prior to the issuance of this report in accordance with the  Standards of Accreditation of the American Academy of Sleep Medicine (AASM).  True Mar, MD, PhD Medical Director, [facility]) Diplomat, ABPN (Neurology and Sleep)              Technical Report:   General Information  Name: Ethelene, Closser BMI: 36.56 Physician: ,   ID: [id number] Height: 64.0 in Technician: Harvey Brisker  Sex: Female Weight: 213.0 lb Record: [id number]  Age: 67 [May 23, 1980] Date: 09/15/2023 Scorer: Brisker Harvey  Medical & Medication History    43 year old female with underlying medical history of anemia, endometriosis, adrenal adenoma, hyperaldosteronism, and obesity, who reports snoring, which has been noted to be loud per mom.  Patient had a hysterectomy earlier this year and mom stayed with her for about a week and noticed that patient snores fairly loudly at times. She has never had a sleep study. She has no witnessed apneas and has not woken up with a sense of gasping for air.  Zyrtec, Probiotic, Tylenol , Advil    Sleep Disorder      Comments   The patient came into the sleep lab for a PSG. No restroom breaks. EKG kept in NSR with Bradycardia. Moderate to loud snoring. All sleep stages witnessed. Respiratory events scored with a 3% desat. Majority of respiratory events while in REM. The patient slept supine only. AHI was 7.9 after 2hrs of TST.     Lights out: 09:50:48 PM Lights on: 05:08:45 AM   Time Total Supine Side Prone Upright  Recording (TRT) 7h 18.50m 7h 18.68m 0h 0.60m 0h 0.9m 0h 0.75m  Sleep (TST) 6h 49.40m 6h 49.39m 0h 0.15m 0h 0.37m 0h 0.49m   Latency N1 N2 N3 REM Onset Per. Slp. Eff.  Actual 0h 17.59m 0h 19.44m 0h 38.88m 1h 5.44m 0h 17.7m 0h 17.53m 93.38%   Stg Dur Wake N1 N2 N3 REM  Total 11.5 10.0 247.5 77.0 74.5  Supine 11.5 10.0 247.5 77.0 74.5  Side 0.0 0.0 0.0 0.0 0.0  Prone 0.0 0.0 0.0 0.0 0.0  Upright 0.0 0.0 0.0 0.0 0.0   Stg % Wake N1 N2 N3 REM  Total 2.7 2.4 60.5 18.8 18.2  Supine 2.7 2.4 60.5 18.8 18.2  Side 0.0 0.0 0.0 0.0 0.0  Prone 0.0 0.0 0.0 0.0 0.0  Upright 0.0 0.0 0.0 0.0 0.0     Apnea Summary Sub Supine Side Prone Upright  Total 6 Total 6 6 0 0 0    REM 5 5 0 0 0    NREM 1 1 0 0 0  Obs 6 REM 5 5 0 0 0    NREM 1 1 0 0 0  Mix 0 REM 0 0 0 0 0    NREM 0 0 0 0 0  Cen 0 REM 0 0 0 0 0    NREM 0 0 0 0 0   Rera Summary Sub Supine Side Prone Upright  Total 0 Total 0 0 0 0 0    REM 0 0 0 0 0    NREM 0 0 0 0 0   Hypopnea Summary Sub Supine Side Prone Upright  Total 64 Total 64 64 0 0 0    REM 35 35 0 0 0    NREM 29 29 0 0 0   4% Hypopnea Summary Sub Supine Side Prone Upright  Total (4%) 28 Total 28 28 0 0 0    REM 21 21 0 0 0    NREM 7 7 0 0 0  AHI Total Obs Mix Cen  10.27  Apnea 0.88 0.88 0.00 0.00   Hypopnea 9.39 -- -- --  4.99 Hypopnea (4%) 4.11 -- -- --    Total Supine Side Prone Upright  Position AHI 10.27 10.27 0.00 0.00 0.00  REM AHI 32.21   NREM AHI 5.38   Position RDI 10.27 10.27 0.00 0.00 0.00  REM RDI 32.21   NREM RDI 5.38    4% Hypopnea Total Supine Side Prone Upright  Position AHI (4%) 4.99 4.99 0.00 0.00 0.00  REM AHI (4%) 20.94   NREM AHI (4%) 1.43   Position RDI (4%) 4.99 4.99 0.00 0.00 0.00  REM RDI (4%) 20.94   NREM RDI (4%) 1.43    Desaturation Information Threshold: 2% <100% <90% <80% <70% <60% <50% <40%  Supine 158.0 5.0 0.0 0.0 0.0 0.0 0.0  Side 0.0 0.0 0.0 0.0 0.0 0.0 0.0  Prone 0.0 0.0 0.0 0.0 0.0 0.0 0.0  Upright 0.0 0.0 0.0 0.0 0.0 0.0 0.0  Total 158.0 5.0 0.0 0.0 0.0 0.0 0.0  Index 22.5 0.7 0.0 0.0 0.0 0.0 0.0   Threshold: 3% <100% <90% <80% <70% <60% <50% <40%  Supine 64.0 5.0 0.0 0.0 0.0 0.0 0.0  Side 0.0 0.0 0.0 0.0 0.0 0.0 0.0  Prone 0.0 0.0 0.0 0.0 0.0 0.0 0.0  Upright 0.0 0.0 0.0 0.0 0.0 0.0 0.0  Total 64.0 5.0 0.0 0.0 0.0 0.0 0.0  Index 9.1 0.7 0.0 0.0 0.0 0.0 0.0   Threshold: 4% <100% <90% <80% <70% <60% <50% <40%  Supine 29.0 5.0 0.0 0.0 0.0 0.0 0.0  Side 0.0 0.0 0.0 0.0 0.0 0.0 0.0  Prone 0.0 0.0 0.0 0.0 0.0 0.0 0.0  Upright 0.0 0.0 0.0 0.0 0.0 0.0 0.0  Total 29.0 5.0 0.0 0.0 0.0 0.0 0.0  Index 4.1 0.7 0.0 0.0 0.0 0.0 0.0   Threshold: 4% <100% <90% <80% <70% <60% <50% <40%  Supine 29 5 0 0 0 0 0  Side 0 0 0 0 0 0 0  Prone 0 0 0 0 0 0 0  Upright 0 0 0 0 0 0 0  Total 29 5 0 0 0 0 0   Awakening/Arousal Information # of Awakenings 15  Wake after sleep onset 11.79m  Wake after persistent sleep 11.33m   Arousal Assoc. Arousals Index  Apneas 2 0.3  Hypopneas 24 3.5  Leg Movements 0 0.0  Snore 0 0.0  PTT Arousals 0 0.0  Spontaneous 87 12.8  Total 113 16.6  Leg Movement Information PLMS LMs Index  Total LMs during PLMS 0 0.0  LMs w/ Microarousals 0 0.0   LM LMs Index  w/ Microarousal 0 0.0   w/ Awakening 0 0.0  w/ Resp Event 0 0.0  Spontaneous 3 0.4  Total 3 0.4     Desaturation threshold setting: 4% Minimum desaturation setting: 10 seconds SaO2 nadir: 88% The longest event was a 94 sec obstructive Hypopnea with a minimum SaO2 of 94%. The lowest SaO2 was 88% associated with a 16 sec obstructive Apnea. EKG Rates EKG Avg Max Min  Awake 90 104 73  Asleep 84 104 63  EKG Events: Tachycardia

## 2023-09-18 NOTE — Telephone Encounter (Signed)
 Relayed results of sleep study below.  DME [facility].  OSA Mild.  Autopap.  Will call when gets machine for 2-3 mo time sensitive appt with us .  She verbalized understanding of results.  Answered all questions. Faxed order to [facility].   Received confirmation.

## 2023-09-18 NOTE — Telephone Encounter (Signed)
-----   Message from True Mar sent at 09/17/2023  2:32 PM EDT ----- Patient referred by PCP, seen by me on 05/27/23, diagnostic PSG on 09/15/23.    Please call and notify the patient that the recent sleep study did confirm the diagnosis of obstructive sleep apnea. OSA is overall mild, but worth treating to see if she feels better after  treatment. To that end I recommend treatment for this in the form of autoPAP, which means, that we don't have to bring her back for a second sleep study with CPAP, but will let him try an autoPAP  machine at home, through a DME company (of her choice, or as per insurance requirement). The DME representative will educate her on how to use the machine, how to put the mask on, etc. I have placed  an order in the chart. Please send referral, talk to patient, send report to referring MD. We will need a FU in sleep clinic for 10 weeks post-PAP set up, please arrange that with me or one of our  NPs. Thanks,   True Mar, MD, PhD [facility])   ----- Message ----- From: Mar True, MD Sent: 09/17/2023   2:27 PM EDT To: True Mar, MD

## 2023-10-03 ENCOUNTER — Telehealth: Payer: Self-pay | Admitting: *Deleted

## 2023-10-03 NOTE — Telephone Encounter (Signed)
 Received fax from [employer] that pt does not qualify for insurance reimbursement due to having a low AHI and no documentation of supporting diagnosis secondary to OSA.

## 2023-10-04 NOTE — Telephone Encounter (Signed)
 Please advise patient that her insurance does not cover AutoPap therapy for her mild sleep apnea.  If she is interested, we can consider treatment with an oral appliance.  It may or may not be covered by insurance but we do not know until she has a consultation with a dentist.  If she would like a referral to dentistry, we can certainly put a referral in.  In the meantime, working on some degree of weight loss can also help improve sleep apnea. Please put in a referral to Dr. Melba or Dr. Micky or dentist of her choice if she is agreeable.

## 2023-10-07 NOTE — Telephone Encounter (Signed)
 I called pt I relayed that per Dr. Buck that insurance will not cover the autopap for her mild sleep apnea. If she is interested we can consider treatment with an oral appliance.  Oral appliance may or may not be approved by insurance but will need to see them in consult prior to determination.  We can put in referral to dentistry if she likes.  Other option is weight loss.  She will think about it and let us  know.

## 2023-12-31 ENCOUNTER — Telehealth: Payer: Self-pay

## 2023-12-31 NOTE — Telephone Encounter (Signed)
 Called and spoke w/ Rosaline who was instructed to take call since Milton on another call. I relayed that CPAP not covered by insurance. Alternate options provided to pt: oral appliance and weight loss. Pt told us  she would think about options and let us  know but she did not call back. She will let Alan know, nothing further needed.

## 2023-12-31 NOTE — Telephone Encounter (Signed)
 I see previous documentation in pt chart:

## 2023-12-31 NOTE — Telephone Encounter (Signed)
 Called Amanda/RMA at [facility] back at [phone]. LVM for her to call.

## 2023-12-31 NOTE — Telephone Encounter (Signed)
 Margaret Farrell from [facility]  called returning call ,Informed that Nurse will call back  Margaret Farrell request  when nurse call back to let phone operator know to Jenelle Margaret Farrell don't send to [employer] [phone]

## 2023-12-31 NOTE — Telephone Encounter (Signed)
 Alan Cookey from [facility] called regarding questions about the DME company and the coverage for the patient's CPAP machine. Possibly needing a new DME company? Alan requested the patient be called back directly, not [facility].

## 2024-01-02 ENCOUNTER — Ambulatory Visit
Admission: RE | Admit: 2024-01-02 | Discharge: 2024-01-02 | Disposition: A | Discharge status: Home / Self Care | Source: Ambulatory Visit | Attending: Obstetrics and Gynecology | Admitting: Obstetrics and Gynecology

## 2024-01-02 DIAGNOSIS — N6310 Unspecified lump in the right breast, unspecified quadrant: Secondary | ICD-10-CM

## 2024-01-03 ENCOUNTER — Ambulatory Visit: Payer: Self-pay | Admitting: Obstetrics and Gynecology

## 2024-01-09 ENCOUNTER — Other Ambulatory Visit: Payer: Self-pay

## 2024-01-20 ENCOUNTER — Other Ambulatory Visit

## 2024-01-24 ENCOUNTER — Ambulatory Visit: Admitting: "Endocrinology

## 2024-04-28 ENCOUNTER — Ambulatory Visit: Admitting: Obstetrics and Gynecology
